# Patient Record
Sex: Male | Born: 1965
Health system: Southern US, Community
[De-identification: ages and names within clinical notes are randomized; demographics above are authoritative.]

## PROBLEM LIST (undated history)

## (undated) DIAGNOSIS — R002 Palpitations: Secondary | ICD-10-CM

## (undated) DIAGNOSIS — I10 Essential (primary) hypertension: Secondary | ICD-10-CM

## (undated) DIAGNOSIS — G4752 REM sleep behavior disorder: Secondary | ICD-10-CM

## (undated) DIAGNOSIS — J984 Other disorders of lung: Secondary | ICD-10-CM

## (undated) HISTORY — DX: Essential (primary) hypertension: I10

## (undated) HISTORY — DX: Palpitations: R00.2

## (undated) HISTORY — DX: Other disorders of lung: J98.4

## (undated) HISTORY — PX: VASECTOMY: SHX75

## (undated) HISTORY — PX: LIPOMA EXCISION: SHX5283

## (undated) HISTORY — DX: REM sleep behavior disorder: G47.52

---

## 2007-06-22 ENCOUNTER — Ambulatory Visit: Payer: Self-pay | Admitting: Family Medicine

## 2007-06-22 DIAGNOSIS — N509 Disorder of male genital organs, unspecified: Secondary | ICD-10-CM | POA: Insufficient documentation

## 2007-06-22 LAB — CONVERTED CEMR LAB
Urobilinogen, UA: 0.2
WBC Urine, dipstick: NEGATIVE

## 2007-06-23 ENCOUNTER — Encounter: Admission: RE | Admit: 2007-06-23 | Discharge: 2007-06-23 | Payer: Self-pay | Admitting: Family Medicine

## 2007-09-06 ENCOUNTER — Ambulatory Visit: Payer: Self-pay | Admitting: Family Medicine

## 2007-09-06 DIAGNOSIS — I10 Essential (primary) hypertension: Secondary | ICD-10-CM

## 2007-09-06 DIAGNOSIS — R9431 Abnormal electrocardiogram [ECG] [EKG]: Secondary | ICD-10-CM

## 2007-09-06 HISTORY — DX: Abnormal electrocardiogram (ECG) (EKG): R94.31

## 2007-09-06 HISTORY — DX: Essential (primary) hypertension: I10

## 2007-09-28 ENCOUNTER — Ambulatory Visit: Payer: Self-pay | Admitting: Cardiology

## 2007-10-14 ENCOUNTER — Telehealth (INDEPENDENT_AMBULATORY_CARE_PROVIDER_SITE_OTHER): Payer: Self-pay | Admitting: *Deleted

## 2008-06-01 ENCOUNTER — Ambulatory Visit: Payer: Self-pay | Admitting: Family Medicine

## 2008-06-01 LAB — CONVERTED CEMR LAB: Rapid Strep: NEGATIVE

## 2008-08-13 ENCOUNTER — Ambulatory Visit: Payer: Self-pay | Admitting: Family Medicine

## 2008-10-03 ENCOUNTER — Ambulatory Visit: Payer: Self-pay | Admitting: Family Medicine

## 2008-10-04 LAB — CONVERTED CEMR LAB
AST: 16 units/L (ref 0–37)
Albumin: 4.5 g/dL (ref 3.5–5.2)
Alkaline Phosphatase: 60 units/L (ref 39–117)
BUN: 16 mg/dL (ref 6–23)
CO2: 28 meq/L (ref 19–32)
Calcium: 9.1 mg/dL (ref 8.4–10.5)
Chloride: 101 meq/L (ref 96–112)
Creatinine, Ser: 1.05 mg/dL (ref 0.40–1.50)
Glucose, Bld: 112 mg/dL — ABNORMAL HIGH (ref 70–99)
LDL Cholesterol: 135 mg/dL — ABNORMAL HIGH (ref 0–99)
Sodium: 140 meq/L (ref 135–145)
Total Protein: 7.1 g/dL (ref 6.0–8.3)
Triglycerides: 111 mg/dL (ref <150)

## 2009-02-01 ENCOUNTER — Ambulatory Visit: Payer: Self-pay | Admitting: Family Medicine

## 2009-02-01 DIAGNOSIS — E785 Hyperlipidemia, unspecified: Secondary | ICD-10-CM

## 2009-02-01 HISTORY — DX: Hyperlipidemia, unspecified: E78.5

## 2009-03-01 ENCOUNTER — Encounter: Admission: RE | Admit: 2009-03-01 | Discharge: 2009-03-01 | Payer: Self-pay | Admitting: Family Medicine

## 2009-03-01 ENCOUNTER — Ambulatory Visit: Payer: Self-pay | Admitting: Family Medicine

## 2009-05-03 ENCOUNTER — Ambulatory Visit: Payer: Self-pay | Admitting: Family Medicine

## 2009-05-03 DIAGNOSIS — H00019 Hordeolum externum unspecified eye, unspecified eyelid: Secondary | ICD-10-CM | POA: Insufficient documentation

## 2009-05-03 LAB — CONVERTED CEMR LAB: Blood Glucose, AC Bkfst: 99 mg/dL

## 2009-08-09 ENCOUNTER — Ambulatory Visit: Payer: Self-pay | Admitting: Family Medicine

## 2009-08-09 DIAGNOSIS — M25559 Pain in unspecified hip: Secondary | ICD-10-CM | POA: Insufficient documentation

## 2009-08-09 DIAGNOSIS — F40298 Other specified phobia: Secondary | ICD-10-CM | POA: Insufficient documentation

## 2009-08-09 DIAGNOSIS — M629 Disorder of muscle, unspecified: Secondary | ICD-10-CM | POA: Insufficient documentation

## 2009-08-20 ENCOUNTER — Encounter: Admission: RE | Admit: 2009-08-20 | Discharge: 2009-09-20 | Payer: Self-pay | Admitting: Family Medicine

## 2009-08-20 ENCOUNTER — Encounter: Payer: Self-pay | Admitting: Family Medicine

## 2009-09-17 ENCOUNTER — Encounter: Payer: Self-pay | Admitting: Family Medicine

## 2009-09-24 ENCOUNTER — Telehealth: Payer: Self-pay | Admitting: Family Medicine

## 2009-09-25 ENCOUNTER — Encounter: Payer: Self-pay | Admitting: Family Medicine

## 2010-02-19 ENCOUNTER — Ambulatory Visit: Payer: Self-pay | Admitting: Family Medicine

## 2010-02-19 DIAGNOSIS — R5383 Other fatigue: Secondary | ICD-10-CM

## 2010-02-19 DIAGNOSIS — R5381 Other malaise: Secondary | ICD-10-CM | POA: Insufficient documentation

## 2010-02-20 ENCOUNTER — Encounter: Payer: Self-pay | Admitting: Family Medicine

## 2010-02-20 LAB — CONVERTED CEMR LAB
Albumin: 4.4 g/dL (ref 3.5–5.2)
Alkaline Phosphatase: 46 units/L (ref 39–117)
BUN: 9 mg/dL (ref 6–23)
Basophils Absolute: 0 10*3/uL (ref 0.0–0.1)
Basophils Relative: 1 % (ref 0–1)
Cholesterol: 198 mg/dL (ref 0–200)
Creatinine, Ser: 0.94 mg/dL (ref 0.40–1.50)
HCT: 44.3 % (ref 39.0–52.0)
LDL Cholesterol: 123 mg/dL — ABNORMAL HIGH (ref 0–99)
Lymphocytes Relative: 31 % (ref 12–46)
MCHC: 33.6 g/dL (ref 30.0–36.0)
Neutro Abs: 3.4 10*3/uL (ref 1.7–7.7)
Platelets: 276 10*3/uL (ref 150–400)
RBC: 4.83 M/uL (ref 4.22–5.81)
RDW: 12.5 % (ref 11.5–15.5)
Total Bilirubin: 0.8 mg/dL (ref 0.3–1.2)
Total Protein: 6.6 g/dL (ref 6.0–8.3)
Triglycerides: 73 mg/dL (ref <150)
VLDL: 15 mg/dL (ref 0–40)
WBC: 5.8 10*3/uL (ref 4.0–10.5)

## 2010-06-17 NOTE — Miscellaneous (Signed)
Summary: PT Initial Summary/[facility]  PT Initial Summary/[facility]   Imported By: Lanelle Bal 08/26/2009 09:09:32  _____________________________________________________________________  External Attachment:    Type:   Image     Comment:   External Document
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
## 2010-06-17 NOTE — Consult Note (Signed)
Summary: [facility]  [facility]   Imported By: Lanelle Bal 10/03/2009 09:19:37  _____________________________________________________________________  External Attachment:    Type:   Image     Comment:   External Document

## 2010-06-17 NOTE — Progress Notes (Signed)
Summary: opthamology referral  Phone Note Call from Patient   Caller: Patient Summary of Call: Pt has stye on L upper eye lid. Pt states you have seen him for this a few times over the past year. Pt would like to know if he can be referred to opthamology [facility]). Please advise. Initial call taken by: Payton Spark CMA,  Sep 24, 2009 11:31 AM  Follow-up for Phone Call        done. Follow-up by: Seymour Bars DO,  Sep 24, 2009 11:51 AM     Appended Document: opthamology referral referral faxed to [facility]

## 2010-06-17 NOTE — Miscellaneous (Signed)
Summary: PT Discharge/[facility]  PT Discharge/[facility]   Imported By: Lanelle Bal 10/04/2009 11:22:45  _____________________________________________________________________  External Attachment:    Type:   Image     Comment:   External Document
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
## 2010-06-27 ENCOUNTER — Ambulatory Visit (INDEPENDENT_AMBULATORY_CARE_PROVIDER_SITE_OTHER): Payer: BC Managed Care – PPO | Admitting: Family Medicine

## 2010-06-27 ENCOUNTER — Encounter: Payer: Self-pay | Admitting: Family Medicine

## 2010-06-27 ENCOUNTER — Telehealth: Payer: Self-pay | Admitting: Family Medicine

## 2010-06-27 DIAGNOSIS — F410 Panic disorder [episodic paroxysmal anxiety] without agoraphobia: Secondary | ICD-10-CM

## 2010-06-27 DIAGNOSIS — I10 Essential (primary) hypertension: Secondary | ICD-10-CM

## 2010-06-27 HISTORY — DX: Panic disorder (episodic paroxysmal anxiety): F41.0

## 2010-07-03 NOTE — Progress Notes (Signed)
Summary: [facility]-med intolerance  Phone Note Call from Patient Call back at Home Phone [phone]   Caller: Patient Summary of Call: Pt states he was given Sertraline at visit today and remembers that he tried Zoloft about 3 years ago and coul no ttolerate as it made him feel like a zombie.  Pt's wife suggests "equivalent, like xanax".  Please advise. Initial call taken by: Francee Piccolo CMA Duncan Dull),  June 27, 2010 4:00 PM  Follow-up for Phone Call        xanax is not the same- it is a 'rescue' medicine for anxiety but we can start with that.  It can be habit forming, make him sleepy and he cannot drink ETOH with it.  I will delete zoloft from list and send in xanax. Follow-up by: Seymour Bars DO,  June 27, 2010 4:47 PM     Appended Document: [facility]-med intolerance notified pt of above.
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
## 2010-08-28 ENCOUNTER — Other Ambulatory Visit: Payer: Self-pay | Admitting: Family Medicine

## 2010-09-23 ENCOUNTER — Other Ambulatory Visit: Payer: Self-pay | Admitting: Family Medicine

## 2010-09-30 ENCOUNTER — Ambulatory Visit (INDEPENDENT_AMBULATORY_CARE_PROVIDER_SITE_OTHER): Payer: BC Managed Care – PPO | Admitting: Family Medicine

## 2010-09-30 ENCOUNTER — Encounter: Payer: Self-pay | Admitting: Family Medicine

## 2010-09-30 DIAGNOSIS — J309 Allergic rhinitis, unspecified: Secondary | ICD-10-CM

## 2010-09-30 DIAGNOSIS — R531 Weakness: Secondary | ICD-10-CM | POA: Insufficient documentation

## 2010-09-30 DIAGNOSIS — M5412 Radiculopathy, cervical region: Secondary | ICD-10-CM

## 2010-09-30 DIAGNOSIS — F40298 Other specified phobia: Secondary | ICD-10-CM

## 2010-09-30 DIAGNOSIS — M5030 Other cervical disc degeneration, unspecified cervical region: Secondary | ICD-10-CM

## 2010-09-30 DIAGNOSIS — I10 Essential (primary) hypertension: Secondary | ICD-10-CM

## 2010-09-30 HISTORY — DX: Allergic rhinitis, unspecified: J30.9

## 2010-09-30 HISTORY — DX: Radiculopathy, cervical region: M54.12

## 2010-09-30 NOTE — Assessment & Plan Note (Signed)
Hx of cervical DDD, now with symptoms of neuritis in the scalp and possibly the cause for anterior R sided neck symptoms given absence of r sided lymphadenopathy or thyromegaly.  He opted to hold off on MRI at this point but will call if he changes his mind.

## 2010-09-30 NOTE — Assessment & Plan Note (Signed)
Exam c/w flare of seasonal allergies.  His postnasal drip may also be the cause for L sided shoddy lymph nodes which I told him were reactive.  His fullness in his throat is likely to be from the postnasal drip. No worrisome findings present.  Be sure to take zyrtec everyday.

## 2010-09-30 NOTE — Patient Instructions (Signed)
F/U with dermatology for spot on R forearm.  Will make psychology referral for you.  Take Zyrtec every night for allergies/ postnasal drip.  Call me if you want to persue neck MRI.  Call me if any problems.  Happy Birthday!!!

## 2010-09-30 NOTE — Assessment & Plan Note (Signed)
BP high here but at goal at home. Continue current meds.

## 2010-09-30 NOTE — Progress Notes (Signed)
  Subjective:    Patient ID: Clinton Cox, male    DOB: 25-Dec-1965, 45 y.o.   MRN: [id number]  HPI 45 yo WM presents for a lump on the R side of his neck.  He thinks it's been there for the past 5 days.  He describes a feeling of 'fullness' only on the R side when he swallows but no sore throat.  He has allergies and is taking Zyrtec on most days but still has itchy watery eyes and postnasal drip.  He also has been checking his BP at home and it's running 135/ 80 on avg.  He has a hx of cervical disc dz, had a w/u for this about 3 yrs ago.  He has been having some tingling and numbness with a burning mild pain in the scalp lately.    BP 143/96  Pulse 81  Temp(Src) 98.6 F (37 C) (Oral)  Ht 5\' 11"  (1.803 m)  Wt 188 lb (85.276 kg)  BMI 26.22 kg/m2  SpO2 100%    Review of Systems  Constitutional: Negative for fever, diaphoresis, appetite change, fatigue and unexpected weight change.  HENT: Positive for congestion, rhinorrhea, sneezing and postnasal drip. Negative for sore throat, trouble swallowing, neck pain, neck stiffness, voice change and sinus pressure.   Eyes: Positive for itching.  Respiratory: Negative for cough, chest tightness and shortness of breath.   Cardiovascular: Negative for chest pain, palpitations and leg swelling.  Gastrointestinal: Negative for nausea and vomiting.  Neurological: Positive for numbness. Negative for dizziness, weakness, light-headedness and headaches.  Psychiatric/Behavioral: Negative for sleep disturbance and dysphoric mood. The patient is nervous/anxious.        Objective:   Physical Exam  Constitutional: He appears well-developed and well-nourished. No distress.  HENT:  Head: Normocephalic and atraumatic.  Right Ear: External ear normal.  Left Ear: External ear normal.  Nose: Nose normal.  Mouth/Throat: Oropharynx is clear and moist.       Clear rhinorrhea; cobblestoning in the post o/p  Eyes: Conjunctivae are normal.       Watery eyes  with mild bilat sclera injection  Neck: Normal range of motion. Neck supple. No thyromegaly present.  Cardiovascular: Normal rate, regular rhythm and normal heart sounds.   No murmur heard. Pulmonary/Chest: Effort normal and breath sounds normal. No respiratory distress. He has no wheezes.  Musculoskeletal: He exhibits no edema.  Lymphadenopathy:    He has cervical adenopathy (shoddy L anterior cervical chain LA, freely moveable and nontender).  Skin: Skin is warm and dry.  Psychiatric: He has a normal mood and affect.          Assessment & Plan:

## 2010-09-30 NOTE — Assessment & Plan Note (Signed)
[facility]                            CARDIOLOGY OFFICE NOTE   NAME:Kernen, EYOEL THROGMORTON                      MRN:          [id number]  DATE:09/28/2007                            DOB:          [birth year]    Mr. Swor is a pleasant 45 year old gentleman who presents for  evaluation of palpitations and mild dyspnea.  He has no prior cardiac  history other than he did wear a monitor in the past secondary to  occasional palpitations.  The patient states that he has been in good  shape in the past, but has not been exercising recently due to  employment issues; and also he has been caring for 2 parents who have  been he ill.  He has noticed that he has had mild increased dyspnea on  exertion with more extreme activities, but not with routine activities.  There is no orthopnea, PND, pedal edema, presyncope, syncope, or  exertional chest pain.  He also occasionally has palpitations.  These  occur predominantly at night.  They are described as his heart  skipping.  They did they are not sustained.  There is mild dyspnea with  these episodes, but there is no syncope or chest pain.  He recently saw  Dr. Cathey Endow, and his electrocardiogram was felt to be abnormal.  Because  the above, we were asked to further evaluate.   MEDICATIONS INCLUDE:  Lisinopril 10 mg daily.   PAST MEDICAL HISTORY:  Significant for recently diagnosed hypertension.  There is no diabetes mellitus or hyperlipidemia.  He has had a previous  lipoma removed from his back.   SOCIAL HISTORY:  He does not smoke, and he rarely consumes alcohol.   FAMILY HISTORY:  Positive for lung cancer in his father, and he died in  06-23-22.  His mother has also been diagnosed with throat cancer.  There  is no coronary artery disease in his family.   REVIEW OF SYSTEMS:  There is no headaches, fevers, or chills.  There is  no shortness of breath, cough, or hemoptysis.  There is no dysphagia,  odynophagia, melena,  or hematochezia.  There is no dysuria or hematuria.  There is no seizure activity.  There is no orthopnea, PND, or pedal  edema.  The remaining systems are negative.   PHYSICAL EXAMINATION:  Today shows a blood pressure 129/80, and his  pulse was 90.  He weighs 185 pounds.  He is well-developed and well-nourished in no acute distress.  SKIN:  Warm and dry.  He does not appear to be depressed.  There is no peripheral clubbing.  BACK:  Normal.  HEENT:  Normal with normal eyelids.  NECK:  Supple with a normal upstroke bilaterally.  There are no bruits  noted.  There is no jugular distention, and no thyromegaly is noted.  CHEST:  Clear to auscultation.  No expansion.  CARDIOVASCULAR EXAM:  Reveals a regular rate and rhythm with a normal S1  and S2.  There are no murmurs, rubs, or gallops noted.  Note, he does have some striae over his shoulders.  ABDOMINAL EXAM:  Nontender.  Positive bowel sounds.  No  hepatosplenomegaly, no mass appreciated.  There is no abdominal bruit.  He has 2+ femoral pulses bilaterally.  No bruits.  EXTREMITIES:  Show no edema.  I could palpate no cords.  He has 2+  dorsalis pedis pulses bilaterally.  NEUROLOGIC EXAM:  Grossly intact.   I do have a copy of his electrocardiogram from Dr. Ovidio Kin office on  September 06, 2007.  He had a sinus tachycardia at a rate of 112.  There is  probable limb lead reversal.  There are nonspecific ST changes.   DIAGNOSIS:  1. Palpitations.  The etiology of this is unclear, but it may be PVCs.      We will schedule him to have a CardioNet monitor to more fully      evaluate.  We will check a BMET to check his potassium as well as a      TSH.  2. Mild dyspnea on exertion.  Mr. Stick has not exercised recently,      and he is contemplating beginning an exercise program.  We will      schedule him to have a stress echocardiogram both to quantify his      LV function and to risk stratify.  If it shows no abnormalities,      then we  will not pursue further ischemia evaluation.  3. Hypertension.  His blood pressure appears to be adequately      controlled on his present medications.  We will check a BMET to      follow his potassium and renal function given his ACE inhibitor      use.   We will see him back in approximately 6 weeks.     Madolyn Frieze Jens Som, MD, [facility]  Electronically Signed    BSC/MedQ  DD: 09/28/2007  DT: 09/28/2007  Job #: [id number]   cc:   Seymour Bars, D.O.

## 2010-09-30 NOTE — Assessment & Plan Note (Signed)
Still a problem.  Clinton Cox admits to constantly worrying about each little lump or bump after losing his dad to cancer and then having his mom diagnosed with cancer.  I really think he would be a good candidate for therapy.  I will refer him to Dr Dellia Cloud.

## 2010-10-17 ENCOUNTER — Ambulatory Visit: Payer: BC Managed Care – PPO | Admitting: Psychology

## 2010-11-04 ENCOUNTER — Encounter: Payer: Self-pay | Admitting: Family Medicine

## 2010-11-04 ENCOUNTER — Telehealth: Payer: Self-pay | Admitting: Family Medicine
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
## 2010-11-04 NOTE — Telephone Encounter (Signed)
Pt called and needed the nurse to return his call regarding stomach issues. Plan:  Call returned but had to [facility] for the patient that his call had been returned. [name], LPN [name]

## 2010-11-05 ENCOUNTER — Encounter: Payer: Self-pay | Admitting: Family Medicine

## 2010-11-05 ENCOUNTER — Ambulatory Visit (INDEPENDENT_AMBULATORY_CARE_PROVIDER_SITE_OTHER): Payer: BC Managed Care – PPO | Admitting: Family Medicine

## 2010-11-05 VITALS — BP 141/86 | HR 82 | Ht 70.0 in | Wt 188.0 lb

## 2010-11-05 DIAGNOSIS — R252 Cramp and spasm: Secondary | ICD-10-CM

## 2010-11-05 DIAGNOSIS — L559 Sunburn, unspecified: Secondary | ICD-10-CM

## 2010-11-05 DIAGNOSIS — E860 Dehydration: Secondary | ICD-10-CM

## 2010-11-05 DIAGNOSIS — R1013 Epigastric pain: Secondary | ICD-10-CM

## 2010-11-05 LAB — POCT URINALYSIS DIPSTICK
Bilirubin, UA: NEGATIVE
Ketones, UA: NEGATIVE
Nitrite, UA: NEGATIVE
pH, UA: 7

## 2010-11-05 NOTE — Patient Instructions (Signed)
UA is normal.  Check electrolytes today.   Will call you w/ results tomorrow.  Use Aloe Vera on sunburn.  Hydrate well with clear fluids (not alcohol) throughout the day.  Call if symptoms worsen or your are not improved by next wk.
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
## 2010-11-06 ENCOUNTER — Telehealth: Payer: Self-pay | Admitting: Family Medicine

## 2010-11-06 LAB — COMPLETE METABOLIC PANEL WITH GFR
ALT: 18 U/L (ref 0–53)
Albumin: 4.3 g/dL (ref 3.5–5.2)
Alkaline Phosphatase: 53 U/L (ref 39–117)
Calcium: 9.7 mg/dL (ref 8.4–10.5)
Chloride: 101 mEq/L (ref 96–112)
Total Bilirubin: 0.4 mg/dL (ref 0.3–1.2)
Total Protein: 6.7 g/dL (ref 6.0–8.3)

## 2010-11-06 LAB — MAGNESIUM: Magnesium: 2 mg/dL (ref 1.5–2.5)

## 2010-11-06 LAB — LIPASE: Lipase: 24 U/L (ref 0–75)

## 2010-11-06 NOTE — Telephone Encounter (Signed)
Pls let pt know that his labs came back normal.

## 2010-11-06 NOTE — Telephone Encounter (Signed)
Pt aware of the above  

## 2010-12-05 ENCOUNTER — Ambulatory Visit: Payer: BC Managed Care – PPO | Admitting: Family Medicine

## 2010-12-08 ENCOUNTER — Other Ambulatory Visit: Payer: Self-pay | Admitting: Family Medicine

## 2011-02-09 ENCOUNTER — Telehealth: Payer: Self-pay | Admitting: Family Medicine

## 2011-02-09 NOTE — Telephone Encounter (Signed)
Pt calling and stating he had heart palpitations and didn't feel good on Saturday X 4 hours, and didn't eat well cause didn't feel good.  Decreased amt of sleep during the night X 1 mth.  Clinching teeth at night because wakes up with his jaw sore. Waking up with mulitple thoughts of dying of cancer and his father's death.  Minimal SOB.  In rib cage off and on some pain # 5/10 and last approx 3 seconds.  Feels weak.  Hands some tingling in fingertips.  Loss of 5 # in last 3 days without loss of appetite.  Pt has been really anxious and history of mental problems and history of his father dying lung CA.  Has been diagnosed with heart palpitations in the past but was told that not life threatening.  Had holter monitor and stress test essentially normal.  Pt is not currently on any med for the anxiousness or depression.  Has used Xanex in the past for anxiousness given.  No palpitations thus far today. [name], LPN [name] Plan:  Note:  Offered appt  For Tuesday with Dr. Maurie Boettcher declined.  Wants to wait to see Dr. Linford Arnold on Wednesday.  Pt told could go to UC today, but declined.  Pt accepted appt on Wednesday with Dr. Linford Arnold, but was instructed to call if symptoms worsen or change because will need to go to [facility] or ED.  Pt voiced understanding. [name], LPN [name]

## 2011-02-11 ENCOUNTER — Ambulatory Visit (INDEPENDENT_AMBULATORY_CARE_PROVIDER_SITE_OTHER): Payer: BC Managed Care – PPO | Admitting: Family Medicine

## 2011-02-11 ENCOUNTER — Encounter: Payer: Self-pay | Admitting: Family Medicine

## 2011-02-11 VITALS — BP 146/99 | HR 83 | Wt 185.0 lb

## 2011-02-11 DIAGNOSIS — R002 Palpitations: Secondary | ICD-10-CM

## 2011-02-11 DIAGNOSIS — F411 Generalized anxiety disorder: Secondary | ICD-10-CM

## 2011-02-11 DIAGNOSIS — F419 Anxiety disorder, unspecified: Secondary | ICD-10-CM

## 2011-02-11 DIAGNOSIS — Z23 Encounter for immunization: Secondary | ICD-10-CM

## 2011-02-11 DIAGNOSIS — R6884 Jaw pain: Secondary | ICD-10-CM
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
## 2011-02-11 NOTE — Patient Instructions (Signed)
Please call me when you decide about the medicine or not.

## 2011-03-19 ENCOUNTER — Encounter: Payer: Self-pay | Admitting: Family Medicine

## 2011-03-19 ENCOUNTER — Other Ambulatory Visit: Payer: Self-pay | Admitting: Family Medicine

## 2011-03-19 ENCOUNTER — Ambulatory Visit (INDEPENDENT_AMBULATORY_CARE_PROVIDER_SITE_OTHER): Payer: BC Managed Care – PPO | Admitting: Family Medicine

## 2011-03-19 VITALS — BP 159/88 | HR 99 | Wt 184.0 lb

## 2011-03-19 DIAGNOSIS — R238 Other skin changes: Secondary | ICD-10-CM

## 2011-03-19 DIAGNOSIS — R6882 Decreased libido: Secondary | ICD-10-CM

## 2011-03-19 DIAGNOSIS — Z0000 Encounter for general adult medical examination without abnormal findings: Secondary | ICD-10-CM

## 2011-03-19 DIAGNOSIS — L853 Xerosis cutis: Secondary | ICD-10-CM

## 2011-03-19 NOTE — Patient Instructions (Signed)
Start a regular exercise program and make sure you are eating a healthy diet Try to eat 4 servings of dairy a day or take a calcium supplement (500mg twice a day). Your vaccines are up to date.   
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
## 2011-03-20 ENCOUNTER — Other Ambulatory Visit: Payer: Self-pay | Admitting: Family Medicine

## 2011-03-20 ENCOUNTER — Telehealth: Payer: Self-pay | Admitting: *Deleted

## 2011-03-20 LAB — HEMOGLOBIN A1C: Mean Plasma Glucose: 111 mg/dL (ref <117)

## 2011-03-20 LAB — LIPID PANEL
Cholesterol: 200 mg/dL (ref 0–200)
HDL: 61 mg/dL (ref >39)
Triglycerides: 77 mg/dL (ref <150)
VLDL: 15 mg/dL (ref 0–40)

## 2011-03-20 LAB — CBC WITH DIFFERENTIAL/PLATELET
Eosinophils Absolute: 0.1 10*3/uL (ref 0.0–0.7)
Eosinophils Relative: 1 % (ref 0–5)
HCT: 45.8 % (ref 39.0–52.0)
Hemoglobin: 15.5 g/dL (ref 13.0–17.0)
Monocytes Absolute: 0.5 10*3/uL (ref 0.1–1.0)
WBC: 5.9 10*3/uL (ref 4.0–10.5)

## 2011-03-20 LAB — COMPLETE METABOLIC PANEL WITH GFR
AST: 18 U/L (ref 0–37)
BUN: 9 mg/dL (ref 6–23)
Calcium: 9.3 mg/dL (ref 8.4–10.5)
Chloride: 100 mEq/L (ref 96–112)
Glucose, Bld: 125 mg/dL — ABNORMAL HIGH (ref 70–99)
Potassium: 4.4 mEq/L (ref 3.5–5.3)
Total Protein: 6.6 g/dL (ref 6.0–8.3)

## 2011-03-20 LAB — TESTOSTERONE, FREE, TOTAL, SHBG
Testosterone-% Free: 1.4 % — ABNORMAL LOW (ref 1.6–2.9)
Testosterone: 614.12 ng/dL (ref 250–890)

## 2011-03-20 LAB — TSH: TSH: 2.247 u[IU]/mL (ref 0.350–4.500)

## 2011-03-20 MED ORDER — KETOCONAZOLE 2 % EX SHAM: MEDICATED_SHAMPOO | CUTANEOUS | Status: AC

## 2011-03-20 NOTE — Telephone Encounter (Signed)
Pt notified.mailed labs

## 2011-03-20 NOTE — Telephone Encounter (Signed)
Message copied by Wyline Beady on Fri Mar 20, 2011  2:43 PM ------      Message from: Nani Gasser D      Created: Fri Mar 20, 2011  8:00 AM       Thyroid and complete blood count are normal. Testosterone level looks great. At 600. LDL cholesterol is just slightly elevated at 124. This is about what he is run the last couple years. I just encouraged him to make sure he is getting at least 30 minutes of aerobic exercise 5 days a week. Complete metabolic panel is normal except for an elevated sugar of 125. Normal is under 100. Can you please call the lab and see if they can add an A1c. Because his thyroid is normal I would like to treat his scalp with Nizoral shampoo about a month and if he still not noticing significant improvement and in the dryness and flaking then we can add a topical steroid foam that he can apply to the scalp. I will send her over the prescription shampoo today.

## 2011-04-28 ENCOUNTER — Telehealth: Payer: Self-pay | Admitting: *Deleted

## 2011-04-28 MED ORDER — FLUOXETINE HCL 10 MG PO CAPS: ORAL_CAPSULE | ORAL | Status: DC

## 2011-04-28 NOTE — Telephone Encounter (Signed)
Pt notified of MD instructions. KJ LPN 

## 2011-04-28 NOTE — Telephone Encounter (Signed)
OK. Will start fluoxetine. Needs f/u appt  In 3 weeks.

## 2011-04-28 NOTE — Telephone Encounter (Signed)
Going on business trip tomorrow to [place] for 5 days. Pt states hasn't been feeling well. States has no energy, arms kinda tingling. Seen at Dr. Ellin Goodie office and done bloodwork and EKG. EKG was fine. Blood work will not be back until tomorrow. Nurse informed him he had a lot of anxiety going on that was causing these symptoms and he said you had told him this as well. Pt would like to start some med for this- not Zoloft though because this made him feeling like a zombie. Got xanax from Dr. Cathey Endow #30 a year ago and has 10 left now.. Uses [facility] in [place].

## 2011-05-05 ENCOUNTER — Encounter: Payer: Self-pay | Admitting: Family Medicine

## 2011-05-07 ENCOUNTER — Encounter: Payer: Self-pay | Admitting: Family Medicine

## 2011-05-07 ENCOUNTER — Ambulatory Visit (INDEPENDENT_AMBULATORY_CARE_PROVIDER_SITE_OTHER): Payer: BC Managed Care – PPO | Admitting: Family Medicine

## 2011-05-07 VITALS — BP 159/96 | HR 87 | Temp 97.7°F | Wt 185.0 lb

## 2011-05-07 DIAGNOSIS — F411 Generalized anxiety disorder: Secondary | ICD-10-CM

## 2011-05-07 DIAGNOSIS — F419 Anxiety disorder, unspecified: Secondary | ICD-10-CM

## 2011-05-07 DIAGNOSIS — M542 Cervicalgia: Secondary | ICD-10-CM

## 2011-05-07 MED ORDER — ALPRAZOLAM 0.5 MG PO TABS: 0.2500 mg | ORAL_TABLET | ORAL | Status: DC | PRN
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
## 2011-05-28 ENCOUNTER — Ambulatory Visit (INDEPENDENT_AMBULATORY_CARE_PROVIDER_SITE_OTHER): Payer: BC Managed Care – PPO | Admitting: Family Medicine

## 2011-05-28 ENCOUNTER — Encounter: Payer: Self-pay | Admitting: Family Medicine

## 2011-05-28 DIAGNOSIS — F419 Anxiety disorder, unspecified: Secondary | ICD-10-CM

## 2011-05-28 DIAGNOSIS — F411 Generalized anxiety disorder: Secondary | ICD-10-CM

## 2011-05-28 MED ORDER — FLUOXETINE HCL 20 MG PO CAPS: 20.0000 mg | ORAL_CAPSULE | Freq: Every day | ORAL | Status: DC

## 2011-05-28 NOTE — Progress Notes (Signed)
  Subjective:    Patient ID: Clinton Cox, male    DOB: 01-29-1966, 46 y.o.   MRN: [id number]  HPI Says has been taking the fluoxetine 20mg  and says feels less anxious but feels dec motivation.  Says felt like a zombie on sertraline.   Saw neuro for his posterior neck.  They are planning to schedule him with ENT.  Still having anterior neck pain.  Did try buying a mouth guard and says made his mouth dry.  It is irritating his gums. Also plans on seeing dentist.   Review of Systems     Objective:   Physical Exam  Constitutional: He is oriented to person, place, and time. He appears well-developed and well-nourished.  HENT:  Head: Normocephalic and atraumatic.  Eyes: Conjunctivae are normal. Pupils are equal, round, and reactive to light.  Cardiovascular: Normal rate, regular rhythm and normal heart sounds.   Pulmonary/Chest: Effort normal and breath sounds normal.  Neurological: He is alert and oriented to person, place, and time.  Skin: Skin is warm and dry.  Psychiatric: He has a normal mood and affect. His behavior is normal.          Assessment & Plan:  Anxiety - GAD-7 score of 5 today.  We discussed different options like inc the fluoxetine to 40mg  vs changing medications. He wants to keep dose the same and f/u in 3 weeks.  We also discussed counseling.    HTN - Looks great today.

## 2011-06-16 ENCOUNTER — Other Ambulatory Visit: Payer: Self-pay | Admitting: *Deleted

## 2011-06-16 MED ORDER — AMLODIPINE BESYLATE 5 MG PO TABS: 5.0000 mg | ORAL_TABLET | Freq: Every day | ORAL | Status: DC

## 2011-06-18 ENCOUNTER — Ambulatory Visit: Payer: BC Managed Care – PPO | Admitting: Family Medicine

## 2011-07-29 ENCOUNTER — Other Ambulatory Visit: Payer: Self-pay | Admitting: *Deleted

## 2011-07-29 MED ORDER — ACYCLOVIR 200 MG PO CAPS: 200.0000 mg | ORAL_CAPSULE | Freq: Every day | ORAL | Status: DC

## 2011-08-19 ENCOUNTER — Ambulatory Visit (INDEPENDENT_AMBULATORY_CARE_PROVIDER_SITE_OTHER): Payer: BC Managed Care – PPO | Admitting: Physician Assistant

## 2011-08-19 ENCOUNTER — Encounter: Payer: Self-pay | Admitting: Physician Assistant

## 2011-08-19 VITALS — BP 154/90 | HR 100 | Wt 182.0 lb

## 2011-08-19 DIAGNOSIS — F410 Panic disorder [episodic paroxysmal anxiety] without agoraphobia: Secondary | ICD-10-CM

## 2011-08-19 DIAGNOSIS — I10 Essential (primary) hypertension: Secondary | ICD-10-CM

## 2011-08-19 DIAGNOSIS — J3489 Other specified disorders of nose and nasal sinuses: Secondary | ICD-10-CM

## 2011-08-19 MED ORDER — ALPRAZOLAM 0.5 MG PO TABS: 0.2500 mg | ORAL_TABLET | ORAL | Status: DC | PRN

## 2011-08-19 NOTE — Progress Notes (Signed)
  Subjective:    Patient ID: Clinton Cox, male    DOB: 11-Jul-1965, 46 y.o.   MRN: [id number]  HPI Patient presents to the clinic because he is concerned about a sore in his nose. Patient was out of town about 2 months ago when he blew his nose and blood came out and [facility]. The nurse practinoer told him to that a sore in his nose look concerning and to get it check out. Patient became very worired. His mom and dad have both had cancer. He went to an ENT and he told him it was nothing to worry about. He gave him cortisporon and saline mist and told him to follow up in 3 months. He used the medications given for 1 moth and nose bleeds did improve. Last night he blew is nose and blood came out again. He does report that he tries sometimes to blow so hard to make it bleed. He wants his nose checked again today. He does have seasonal allergies but denies any sinus pressure or pain. He has not ran a fever, has not had ear pain, and no cough or SOB. He has been so worried about his nose that he had to take a xanax before his office visit today.     Review of Systems     Objective:   Physical Exam  Constitutional: He appears well-developed and well-nourished.  HENT:  Head: Normocephalic and atraumatic.  Right Ear: External ear normal.  Left Ear: External ear normal.  Mouth/Throat: Oropharynx is clear and moist. No oropharyngeal exudate.       TM's normal. Bilateral turbinates very red, swollen, and irritated. Dried crusted blood in left nare behind a swollen turbinate. This is likey the cause of the blood when he blows his nose.  Eyes: Conjunctivae are normal.  Neck: Normal range of motion. Neck supple.  Cardiovascular: Regular rhythm and normal heart sounds.   Pulmonary/Chest: Effort normal and breath sounds normal.  Lymphadenopathy:    He has no cervical adenopathy.  Psychiatric:       Very anxious throughout the whole interview. I had to reassure him multiple times that he  was going to be all right.           Assessment & Plan:  Nasal dryness-Continue on Ayr and Cortisporin Ointment and follow up with ENT. INformed patient to stop blowing nose so hard and so frequently. Start back on Zyrtec allergy pill daily if allergies start acting up and he becomes more runny. I informed him that Zyrtec may dry him out more. Use a humidifier at night and in bedroom. I reassured him that the ENT had checked him out and I had seen nothing concerning. He was very dry and irritated but could be from dry weather and trauma from blowing too hard too often.  HTN/Painic disorder-Today the patient did not want to change any medications stating "that was too much today". I believe that the increased bp could be from his anxiety today. I told him to take bp at home and if still about 140/90 then come in for a visit.

## 2011-08-19 NOTE — Patient Instructions (Addendum)
Nasal dryness-Continue on Ayr and Cortisporin Ointment and follow up with ENT. INformed patient to stop blowing nose so hard and so frequently. Start back on Zyrtec allergy pill daily if allergies start acting up and he becomes more runny. I informed him that Zyrtec may dry him out more. Use a humidifier at night and in bedroom. I reassured him that the ENT had checked him out and I had seen nothing concerning. He was very dry and irritated but could be from dry weather and trauma from blowing too hard too often.

## 2011-09-11 ENCOUNTER — Other Ambulatory Visit: Payer: Self-pay | Admitting: Family Medicine

## 2011-09-28 ENCOUNTER — Other Ambulatory Visit: Payer: Self-pay | Admitting: Family Medicine

## 2011-10-22 ENCOUNTER — Ambulatory Visit (INDEPENDENT_AMBULATORY_CARE_PROVIDER_SITE_OTHER): Payer: BC Managed Care – PPO | Admitting: Family Medicine

## 2011-10-22 ENCOUNTER — Encounter: Payer: Self-pay | Admitting: Family Medicine

## 2011-10-22 VITALS — BP 136/89 | HR 84 | Temp 98.0°F | Ht 70.0 in | Wt 187.0 lb

## 2011-10-22 DIAGNOSIS — J209 Acute bronchitis, unspecified: Secondary | ICD-10-CM

## 2011-10-22 DIAGNOSIS — J029 Acute pharyngitis, unspecified: Secondary | ICD-10-CM

## 2011-10-22 LAB — POCT RAPID STREP A (OFFICE): Rapid Strep A Screen: NEGATIVE

## 2011-10-22 MED ORDER — AZITHROMYCIN 250 MG PO TABS: ORAL_TABLET | ORAL | Status: AC

## 2011-10-22 MED ORDER — FEXOFENADINE-PSEUDOEPHED ER 180-240 MG PO TB24: 1.0000 | ORAL_TABLET | Freq: Every day | ORAL | Status: AC

## 2011-10-22 MED ORDER — HYDROCOD POLST-CHLORPHEN POLST 10-8 MG/5ML PO LQCR: 5.0000 mL | Freq: Two times a day (BID) | ORAL | Status: DC | PRN

## 2011-10-22 NOTE — Progress Notes (Signed)
  Subjective:    Patient ID: Clinton Cox, male    DOB: 21-Jun-1965, 46 y.o.   MRN: [id number]  Sore Throat  This is a new problem. The current episode started in the past 7 days (since Sunday). The problem has been gradually worsening. The fever has been present for 1 to 2 days. Associated symptoms include congestion, coughing, diarrhea, ear pain, a hoarse voice, swollen glands and trouble swallowing. Pertinent negatives include no abdominal pain, drooling, ear discharge, headaches, plugged ear sensation, shortness of breath, stridor or vomiting. He has had exposure to strep. He has had no exposure to mono. Exposure to: was sitting in the ED for Sunday. He has tried acetaminophen (psuedophed) for the symptoms. The treatment provided mild relief.  Cough This is a new problem. The current episode started in the past 7 days (since Tuesday). The problem has been gradually worsening. The problem occurs hourly. The cough is productive of sputum. Associated symptoms include ear pain. Pertinent negatives include no headaches or shortness of breath. The symptoms are aggravated by nothing. The treatment provided no relief. There is no history of asthma, bronchitis, COPD, emphysema, environmental allergies or pneumonia.      Review of Systems  HENT: Positive for ear pain, congestion, hoarse voice and trouble swallowing. Negative for drooling and ear discharge.   Respiratory: Positive for cough. Negative for shortness of breath and stridor.   Gastrointestinal: Positive for diarrhea. Negative for vomiting and abdominal pain.  Neurological: Negative for headaches.  Hematological: Negative for environmental allergies.      BP 136/89  Pulse 84  Temp(Src) 98 F (36.7 C) (Oral)  Wt 187 lb (84.823 kg)  SpO2 98% Objective:   Physical Exam  Nursing note and vitals reviewed. Constitutional: He is oriented to person, place, and time. He appears well-developed and well-nourished. He is cooperative.  Non-toxic  appearance. No distress.  HENT:  Head: Normocephalic and atraumatic.  Right Ear: Tympanic membrane, external ear and ear canal normal.  Left Ear: Tympanic membrane, external ear and ear canal normal.  Nose: Nose normal. Right sinus exhibits no maxillary sinus tenderness and no frontal sinus tenderness. Left sinus exhibits no maxillary sinus tenderness and no frontal sinus tenderness.  Mouth/Throat: Mucous membranes are normal. Posterior oropharyngeal erythema present. No oropharyngeal exudate or posterior oropharyngeal edema.  Eyes: Conjunctivae are normal. No scleral icterus.  Neck: Neck supple.  Cardiovascular: Normal rate, regular rhythm and normal heart sounds.   No murmur heard. Pulmonary/Chest: Effort normal and breath sounds normal. No stridor. No respiratory distress. He has no wheezes. He has no rales.  Musculoskeletal: He exhibits no edema.  Lymphadenopathy:    He has cervical adenopathy.       Right cervical: Superficial cervical adenopathy present. No deep cervical and no posterior cervical adenopathy present.      Left cervical: Superficial cervical adenopathy present. No deep cervical and no posterior cervical adenopathy present.  Neurological: He is alert and oriented to person, place, and time.  Skin: Skin is warm and dry.  Psychiatric: He has a normal mood and affect.          Results for orders placed in visit on 10/22/11  POCT RAPID STREP A (OFFICE)      Component Value Range   Rapid Strep A Screen Negative  Negative    Assessment & Plan:  #1 pharyngitis/bronchitis We'll place on Z-Pak, Allegra-D, Tussionex  At this time return in a week if not better.

## 2011-10-22 NOTE — Patient Instructions (Signed)
Pharyngitis, Viral and Bacterial  Pharyngitis is soreness (inflammation) or infection of the pharynx. It is also called a sore throat.  CAUSES   Most sore throats are caused by viruses and are part of a cold. However, some sore throats are caused by strep and other bacteria. Sore throats can also be caused by post nasal drip from draining sinuses, allergies and sometimes from sleeping with an open mouth. Infectious sore throats can be spread from person to person by coughing, sneezing and sharing cups or eating utensils.  TREATMENT   Sore throats that are viral usually last 3-4 days. Viral illness will get better without medications (antibiotics). Strep throat and other bacterial infections will usually begin to get better about 24-48 hours after you begin to take antibiotics.  HOME CARE INSTRUCTIONS    If the caregiver feels there is a bacterial infection or if there is a positive strep test, they will prescribe an antibiotic. The full course of antibiotics must be taken. If the full course of antibiotic is not taken, you or your child may become ill again. If you or your child has strep throat and do not finish all of the medication, serious heart or kidney diseases may develop.   Drink enough water and fluids to keep your urine clear or pale yellow.   Only take over-the-counter or prescription medicines for pain, discomfort or fever as directed by your caregiver.   Get lots of rest.   Gargle with salt water ( tsp. of salt in a glass of water) as often as every 1-2 hours as you need for comfort.   Hard candies may soothe the throat if individual is not at risk for choking. Throat sprays or lozenges may also be used.  SEEK MEDICAL CARE IF:    Large, tender lumps in the neck develop.   A rash develops.   Green, yellow-brown or bloody sputum is coughed up.   Your baby is older than 3 months with a rectal temperature of 100.5 F (38.1 C) or higher for more than 1 day.  SEEK IMMEDIATE MEDICAL CARE IF:    A  stiff neck develops.   You or your child are drooling or unable to swallow liquids.   You or your child are vomiting, unable to keep medications or liquids down.   You or your child has severe pain, unrelieved with recommended medications.   You or your child are having difficulty breathing (not due to stuffy nose).   You or your child are unable to fully open your mouth.   You or your child develop redness, swelling, or severe pain anywhere on the neck.   You have a fever.   Your baby is older than 3 months with a rectal temperature of 102 F (38.9 C) or higher.   Your baby is 3 months old or younger with a rectal temperature of 100.4 F (38 C) or higher.  MAKE SURE YOU:    Understand these instructions.   Will watch your condition.   Will get help right away if you are not doing well or get worse.  Document Released: 05/04/2005 Document Revised: 04/23/2011 Document Reviewed: 08/01/2007  [employer] Patient Information 2012 [employer]  Bronchitis  Bronchitis is the body's way of reacting to injury and/or infection (inflammation) of the bronchi. Bronchi are the air tubes that extend from the windpipe into the lungs. If the inflammation becomes severe, it may cause shortness of breath.  CAUSES   Inflammation may be caused by:     A virus.   Germs (bacteria).   Dust.   Allergens.   Pollutants and many other irritants.  The cells lining the bronchial tree are covered with tiny hairs (cilia). These constantly beat upward, away from the lungs, toward the mouth. This keeps the lungs free of pollutants. When these cells become too irritated and are unable to do their job, mucus begins to develop. This causes the characteristic cough of bronchitis. The cough clears the lungs when the cilia are unable to do their job. Without either of these protective mechanisms, the mucus would settle in the lungs. Then you would develop pneumonia.  Smoking is a common cause of bronchitis and can contribute to  pneumonia. Stopping this habit is the single most important thing you can do to help yourself.  TREATMENT    Your caregiver may prescribe an antibiotic if the cough is caused by bacteria. Also, medicines that open up your airways make it easier to breathe. Your caregiver may also recommend or prescribe an expectorant. It will loosen the mucus to be coughed up. Only take over-the-counter or prescription medicines for pain, discomfort, or fever as directed by your caregiver.   Removing whatever causes the problem (smoking, for example) is critical to preventing the problem from getting worse.   Cough suppressants may be prescribed for relief of cough symptoms.   Inhaled medicines may be prescribed to help with symptoms now and to help prevent problems from returning.   For those with recurrent (chronic) bronchitis, there may be a need for steroid medicines.  SEEK IMMEDIATE MEDICAL CARE IF:    During treatment, you develop more pus-like mucus (purulent sputum).   You have a fever.   Your baby is older than 3 months with a rectal temperature of 102 F (38.9 C) or higher.   Your baby is 3 months old or younger with a rectal temperature of 100.4 F (38 C) or higher.   You become progressively more ill.   You have increased difficulty breathing, wheezing, or shortness of breath.  It is necessary to seek immediate medical care if you are elderly or sick from any other disease.  MAKE SURE YOU:    Understand these instructions.   Will watch your condition.   Will get help right away if you are not doing well or get worse.  Document Released: 05/04/2005 Document Revised: 04/23/2011 Document Reviewed: 03/13/2008  [employer] Patient Information 2012 [employer]

## 2011-10-30 ENCOUNTER — Ambulatory Visit (INDEPENDENT_AMBULATORY_CARE_PROVIDER_SITE_OTHER): Payer: BC Managed Care – PPO | Admitting: Family Medicine

## 2011-10-30 ENCOUNTER — Encounter: Payer: Self-pay | Admitting: Family Medicine

## 2011-10-30 VITALS — BP 138/88 | HR 80 | Temp 97.6°F | Ht 71.0 in | Wt 184.0 lb

## 2011-10-30 DIAGNOSIS — M542 Cervicalgia: Secondary | ICD-10-CM

## 2011-10-30 DIAGNOSIS — J209 Acute bronchitis, unspecified: Secondary | ICD-10-CM

## 2011-10-30 NOTE — Progress Notes (Signed)
  Subjective:    Patient ID: Clinton Cox, male    DOB: 11/28/1965, 47 y.o.   MRN: [id number]  HPI Pharyngitis/Bronchitis - Took zpack and completed this 3 days ago. Feels much better but still some chest congsetion.  On Tuesday had lost of nasal discharge that was bright yellow. Was on one side of nostril. Then next day was the other side. Now has had more clear nasal discharge. Has a spot in his throat that when he exerts himself ( like spitting) will get sudden dull pain on the right side of neck. Lasts for seconds.  Yesterday everytime swallowed it hurt.  Today it is gone.     Review of Systems     Objective:   Physical Exam  Constitutional: He is oriented to person, place, and time. He appears well-developed and well-nourished.  HENT:  Head: Normocephalic and atraumatic.  Right Ear: External ear normal.  Left Ear: External ear normal.  Nose: Nose normal.  Mouth/Throat: Oropharynx is clear and moist.       TMs and canals are clear.   Eyes: Conjunctivae and EOM are normal. Pupils are equal, round, and reactive to light.  Neck: Neck supple. No tracheal deviation present. No thyromegaly present.  Cardiovascular: Normal rate and normal heart sounds.   Pulmonary/Chest: Effort normal and breath sounds normal.  Lymphadenopathy:    He has no cervical adenopathy.  Neurological: He is alert and oriented to person, place, and time.  Skin: Skin is warm and dry.  Psychiatric: He has a normal mood and affect.          Assessment & Plan:  Acute bronchitis - Much improved. Still some residual symptoms.  Doing well. Call if suddenly gets worse. Gave him reassurance about the nasal drainage.   Neck pain - It is likey muscle spasm or could be radiating from his neck.  No lesion or nodules on exam.  He also has TMJ and impacted wisdom teeth which could be contributing.  Call if getting worse but he is better today.

## 2011-12-01 ENCOUNTER — Other Ambulatory Visit: Payer: Self-pay | Admitting: *Deleted

## 2011-12-01 MED ORDER — ALPRAZOLAM 0.5 MG PO TABS: 0.2500 mg | ORAL_TABLET | ORAL | Status: DC | PRN

## 2011-12-01 NOTE — Telephone Encounter (Signed)
Patient calls and would like a refill on his xanax. States it helps with his anxiety and sleep. Has TMJ and was up all last night with it and knows its from stress that triggers the TMJ and grinding. Please advise

## 2011-12-01 NOTE — Telephone Encounter (Signed)
Okay for 30 with no refills.

## 2011-12-16 ENCOUNTER — Other Ambulatory Visit: Payer: Self-pay | Admitting: Family Medicine

## 2011-12-27 ENCOUNTER — Other Ambulatory Visit: Payer: Self-pay | Admitting: Family Medicine

## 2012-03-16 ENCOUNTER — Other Ambulatory Visit: Payer: Self-pay | Admitting: *Deleted

## 2012-03-16 ENCOUNTER — Encounter: Payer: Self-pay | Admitting: Family Medicine

## 2012-03-16 ENCOUNTER — Ambulatory Visit (INDEPENDENT_AMBULATORY_CARE_PROVIDER_SITE_OTHER): Payer: BC Managed Care – PPO | Admitting: Family Medicine

## 2012-03-16 VITALS — BP 139/89 | HR 89 | Temp 97.7°F | Ht 71.0 in | Wt 189.0 lb

## 2012-03-16 DIAGNOSIS — H6090 Unspecified otitis externa, unspecified ear: Secondary | ICD-10-CM

## 2012-03-16 DIAGNOSIS — H60399 Other infective otitis externa, unspecified ear: Secondary | ICD-10-CM

## 2012-03-16 DIAGNOSIS — H6980 Other specified disorders of Eustachian tube, unspecified ear: Secondary | ICD-10-CM

## 2012-03-16 MED ORDER — MOMETASONE FUROATE 50 MCG/ACT NA SUSP: NASAL | Status: DC

## 2012-03-16 MED ORDER — CIPROFLOXACIN-DEXAMETHASONE 0.3-0.1 % OT SUSP: 4.0000 [drp] | Freq: Two times a day (BID) | OTIC | Status: DC

## 2012-03-16 MED ORDER — ALPRAZOLAM 0.5 MG PO TABS: 0.2500 mg | ORAL_TABLET | ORAL | Status: DC | PRN
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
## 2012-04-03 ENCOUNTER — Other Ambulatory Visit: Payer: Self-pay | Admitting: Family Medicine

## 2012-04-04 ENCOUNTER — Ambulatory Visit (INDEPENDENT_AMBULATORY_CARE_PROVIDER_SITE_OTHER): Payer: BC Managed Care – PPO | Admitting: Family Medicine

## 2012-04-04 ENCOUNTER — Encounter: Payer: Self-pay | Admitting: Family Medicine

## 2012-04-04 VITALS — BP 142/87 | HR 94 | Temp 97.8°F | Ht 71.0 in | Wt 189.0 lb

## 2012-04-04 DIAGNOSIS — H9209 Otalgia, unspecified ear: Secondary | ICD-10-CM

## 2012-04-04 DIAGNOSIS — H60399 Other infective otitis externa, unspecified ear: Secondary | ICD-10-CM

## 2012-04-04 DIAGNOSIS — H6090 Unspecified otitis externa, unspecified ear: Secondary | ICD-10-CM

## 2012-04-04 MED ORDER — HYDROCORTISONE-ACETIC ACID 1-2 % OT SOLN: 4.0000 [drp] | Freq: Three times a day (TID) | OTIC | Status: DC
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
## 2012-04-20 ENCOUNTER — Other Ambulatory Visit: Payer: Self-pay | Admitting: Family Medicine

## 2012-04-20 NOTE — Telephone Encounter (Signed)
Needs f/u appt 

## 2012-06-21 ENCOUNTER — Other Ambulatory Visit: Payer: Self-pay | Admitting: Family Medicine

## 2012-06-21 ENCOUNTER — Ambulatory Visit (INDEPENDENT_AMBULATORY_CARE_PROVIDER_SITE_OTHER): Payer: BC Managed Care – PPO

## 2012-06-21 ENCOUNTER — Encounter: Payer: Self-pay | Admitting: Family Medicine

## 2012-06-21 ENCOUNTER — Ambulatory Visit (INDEPENDENT_AMBULATORY_CARE_PROVIDER_SITE_OTHER): Payer: BC Managed Care – PPO | Admitting: Family Medicine

## 2012-06-21 ENCOUNTER — Telehealth: Payer: Self-pay | Admitting: Family Medicine

## 2012-06-21 VITALS — BP 141/90 | HR 87 | Wt 194.0 lb

## 2012-06-21 DIAGNOSIS — R079 Chest pain, unspecified: Secondary | ICD-10-CM

## 2012-06-21 DIAGNOSIS — R0781 Pleurodynia: Secondary | ICD-10-CM

## 2012-06-21 DIAGNOSIS — M940 Chondrocostal junction syndrome [Tietze]: Secondary | ICD-10-CM

## 2012-06-21 DIAGNOSIS — I10 Essential (primary) hypertension: Secondary | ICD-10-CM

## 2012-06-21 MED ORDER — AMLODIPINE BESYLATE 5 MG PO TABS: 5.0000 mg | ORAL_TABLET | Freq: Every day | ORAL | Status: DC

## 2012-06-21 MED ORDER — DICLOFENAC SODIUM 50 MG PO TBEC: DELAYED_RELEASE_TABLET | ORAL | Status: DC
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
## 2012-06-21 NOTE — Telephone Encounter (Signed)
Pt.notified

## 2012-06-21 NOTE — Telephone Encounter (Signed)
Sue Lush, Will you please let mr. Harbaugh know his xrays were reassuring and showed no abnormalites.  I still think his pain is from costocondritis like we discussed.  He can take the diclofenac rx i gave him and should return in about two weeks after taking it easy from an exercise standpoint.

## 2012-08-31 ENCOUNTER — Ambulatory Visit (INDEPENDENT_AMBULATORY_CARE_PROVIDER_SITE_OTHER): Payer: BC Managed Care – PPO

## 2012-08-31 ENCOUNTER — Encounter: Payer: Self-pay | Admitting: Sports Medicine

## 2012-08-31 ENCOUNTER — Other Ambulatory Visit: Payer: Self-pay | Admitting: *Deleted

## 2012-08-31 ENCOUNTER — Ambulatory Visit (INDEPENDENT_AMBULATORY_CARE_PROVIDER_SITE_OTHER): Payer: BC Managed Care – PPO | Admitting: Sports Medicine

## 2012-08-31 VITALS — BP 147/97 | HR 81

## 2012-08-31 DIAGNOSIS — M545 Low back pain, unspecified: Secondary | ICD-10-CM

## 2012-08-31 DIAGNOSIS — M549 Dorsalgia, unspecified: Secondary | ICD-10-CM

## 2012-08-31 DIAGNOSIS — M439 Deforming dorsopathy, unspecified: Secondary | ICD-10-CM

## 2012-08-31 HISTORY — DX: Low back pain, unspecified: M54.50

## 2012-08-31 MED ORDER — CYCLOBENZAPRINE HCL 10 MG PO TABS: ORAL_TABLET | ORAL | Status: DC

## 2012-08-31 MED ORDER — MELOXICAM 15 MG PO TABS: ORAL_TABLET | ORAL | Status: DC

## 2012-08-31 MED ORDER — PREDNISONE 50 MG PO TABS: ORAL_TABLET | ORAL | Status: DC

## 2012-08-31 NOTE — Progress Notes (Signed)
   Subjective:    I'm seeing this patient as a consultation for:  Dr. Linford Arnold.  CC: Back pain.  HPI: Low Back pain:  This pleasant 46 room comes in with a four-day history of pain that he localizes any lower left side of his low back.  He does not recall any trauma, overuse. Pain is localized but does radiate up the paraspinal muscles predominantly on the left side. He denies any radiation down the leg. Pain is worse with flexion, and sitting for long periods of time, as well as with Valsalva. He denies any bowel or bladder changes, and denies any constitutional symptoms such as fevers or chills. No trauma. Ibuprofen does relieve his pain but only minimally so.  Past medical history, Surgical history, Family history not pertinant except as noted below, Social history, Allergies, and medications have been entered into the medical record, reviewed, and no changes needed.   Review of Systems: No headache, visual changes, nausea, vomiting, diarrhea, constipation, dizziness, abdominal pain, skin rash, fevers, chills, night sweats, weight loss, swollen lymph nodes, body aches, joint swelling, muscle aches, chest pain, shortness of breath, mood changes, visual or auditory hallucinations.   Objective:   General: Well Developed, well nourished, and in no acute distress.  Neuro/Psych: Alert and oriented x3, extra-ocular muscles intact, able to move all 4 extremities, sensation grossly intact. Skin: Warm and dry, no rashes noted.  Respiratory: Not using accessory muscles, speaking in full sentences, trachea midline.  Cardiovascular: Pulses palpable, no extremity edema. Abdomen: Does not appear distended. Back Exam:  Inspection: Unremarkable  Motion: Flexion 45 deg, Extension 45 deg, Side Bending to 45 deg bilaterally,  Rotation to 45 deg bilaterally  SLR laying: Negative  XSLR laying: Negative  Palpable tenderness: Left PSIS. FABER: negative. Sensory change: Gross sensation intact to all lumbar  and sacral dermatomes.  Reflexes: 2+ at both patellar tendons, 2+ at achilles tendons, Babinski's downgoing.  Strength at foot  Plantar-flexion: 5/5 Dorsi-flexion: 5/5 Eversion: 5/5 Inversion: 5/5  Leg strength  Quad: 5/5 Hamstring: 5/5 Hip flexor: 5/5 Hip abductors: 5/5  Gait unremarkable. Impression and Recommendations:   This case required medical decision making of moderate complexity.

## 2012-08-31 NOTE — Telephone Encounter (Signed)
rx printed

## 2012-08-31 NOTE — Telephone Encounter (Signed)
Pt is asking if we can refill his Xanax. Please advise.

## 2012-08-31 NOTE — Assessment & Plan Note (Signed)
Axilla likely discogenic. Prednisone, Mobic, Flexeril, x-rays, on rehabilitation. Return in 4 weeks, MRI if no better.

## 2012-09-01 ENCOUNTER — Other Ambulatory Visit: Payer: Self-pay | Admitting: Family Medicine

## 2012-09-01 MED ORDER — ALPRAZOLAM 0.5 MG PO TABS: 0.2500 mg | ORAL_TABLET | ORAL | Status: DC | PRN

## 2012-09-01 NOTE — Telephone Encounter (Signed)
Needs f/u before future refills  

## 2012-10-07 ENCOUNTER — Other Ambulatory Visit: Payer: Self-pay | Admitting: Family Medicine

## 2012-11-11 ENCOUNTER — Other Ambulatory Visit: Payer: Self-pay | Admitting: Family Medicine

## 2012-11-18 ENCOUNTER — Other Ambulatory Visit: Payer: Self-pay | Admitting: Family Medicine

## 2012-12-06 ENCOUNTER — Ambulatory Visit (INDEPENDENT_AMBULATORY_CARE_PROVIDER_SITE_OTHER): Payer: BC Managed Care – PPO | Admitting: Family Medicine

## 2012-12-06 ENCOUNTER — Encounter: Payer: Self-pay | Admitting: Family Medicine

## 2012-12-06 VITALS — BP 143/91 | HR 72 | Wt 193.0 lb

## 2012-12-06 DIAGNOSIS — J309 Allergic rhinitis, unspecified: Secondary | ICD-10-CM

## 2012-12-06 DIAGNOSIS — R42 Dizziness and giddiness: Secondary | ICD-10-CM

## 2012-12-06 MED ORDER — FLUTICASONE PROPIONATE 50 MCG/ACT NA SUSP: 2.0000 | Freq: Every day | NASAL | Status: DC
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
## 2012-12-29 ENCOUNTER — Encounter: Payer: Self-pay | Admitting: Family Medicine

## 2012-12-29 ENCOUNTER — Ambulatory Visit (INDEPENDENT_AMBULATORY_CARE_PROVIDER_SITE_OTHER): Payer: BC Managed Care – PPO | Admitting: Family Medicine

## 2012-12-29 VITALS — BP 148/91 | HR 79 | Ht 72.0 in | Wt 190.0 lb

## 2012-12-29 DIAGNOSIS — J3489 Other specified disorders of nose and nasal sinuses: Secondary | ICD-10-CM

## 2012-12-29 DIAGNOSIS — J340 Abscess, furuncle and carbuncle of nose: Secondary | ICD-10-CM

## 2012-12-29 DIAGNOSIS — I10 Essential (primary) hypertension: Secondary | ICD-10-CM

## 2012-12-29 DIAGNOSIS — Z0000 Encounter for general adult medical examination without abnormal findings: Secondary | ICD-10-CM

## 2012-12-29 DIAGNOSIS — H539 Unspecified visual disturbance: Secondary | ICD-10-CM

## 2012-12-29 NOTE — Patient Instructions (Addendum)
Bring in BP cuff to compare to ours for blood pressure. 2 Gram Low Sodium Diet A 2 gram sodium diet restricts the amount of sodium in the diet to no more than 2 g or 2000 mg daily. Limiting the amount of sodium is often used to help lower blood pressure. It is important if you have heart, liver, or kidney problems. Many foods contain sodium for flavor and sometimes as a preservative. When the amount of sodium in a diet needs to be low, it is important to know what to look for when choosing foods and drinks. The following includes some information and guidelines to help make it easier for you to adapt to a low sodium diet. QUICK TIPS  Do not add salt to food.  Avoid convenience items and fast food.  Choose unsalted snack foods.  Buy lower sodium products, often labeled as "lower sodium" or "no salt added."  Check food labels to learn how much sodium is in 1 serving.  When eating at a restaurant, ask that your food be prepared with less salt or none, if possible. READING FOOD LABELS FOR SODIUM INFORMATION The nutrition facts label is a good place to find how much sodium is in foods. Look for products with no more than 500 to 600 mg of sodium per meal and no more than 150 mg per serving. Remember that 2 g = 2000 mg. The food label may also list foods as:  Sodium-free: Less than 5 mg in a serving.  Very low sodium: 35 mg or less in a serving.  Low-sodium: 140 mg or less in a serving.  Light in sodium: 50% less sodium in a serving. For example, if a food that usually has 300 mg of sodium is changed to become light in sodium, it will have 150 mg of sodium.  Reduced sodium: 25% less sodium in a serving. For example, if a food that usually has 400 mg of sodium is changed to reduced sodium, it will have 300 mg of sodium. CHOOSING FOODS Grains  Avoid: Salted crackers and snack items. Some cereals, including instant hot cereals. Bread stuffing and biscuit mixes. Seasoned rice or pasta  mixes.  Choose: Unsalted snack items. Low-sodium cereals, oats, puffed wheat and rice, shredded wheat. English muffins and bread. Pasta. Meats  Avoid: Salted, canned, smoked, spiced, pickled meats, including fish and poultry. Bacon, ham, sausage, cold cuts, hot dogs, anchovies.  Choose: Low-sodium canned tuna and salmon. Fresh or frozen meat, poultry, and fish. Dairy  Avoid: Processed cheese and spreads. Cottage cheese. Buttermilk and condensed milk. Regular cheese.  Choose: Milk. Low-sodium cottage cheese. Yogurt. Sour cream. Low-sodium cheese. Fruits and Vegetables  Avoid: Regular canned vegetables. Regular canned tomato sauce and paste. Frozen vegetables in sauces. Olives. Rosita Fire. Relishes. Sauerkraut.  Choose: Low-sodium canned vegetables. Low-sodium tomato sauce and paste. Frozen or fresh vegetables. Fresh and frozen fruit. Condiments  Avoid: Canned and packaged gravies. Worcestershire sauce. Tartar sauce. Barbecue sauce. Soy sauce. Steak sauce. Ketchup. Onion, garlic, and table salt. Meat flavorings and tenderizers.  Choose: Fresh and dried herbs and spices. Low-sodium varieties of mustard and ketchup. Lemon juice. Tabasco sauce. Horseradish. SAMPLE 2 GRAM SODIUM MEAL PLAN Breakfast / Sodium (mg)  1 cup low-fat milk / 143 mg  2 slices whole-wheat toast / 270 mg  1 tbs heart-healthy margarine / 153 mg  1 hard-boiled egg / 139 mg  1 small orange / 0 mg Lunch / Sodium (mg)  1 cup raw carrots / 76 mg  cup hummus / 298 mg  1 cup low-fat milk / 143 mg   cup red grapes / 2 mg  1 whole-wheat pita bread / 356 mg Dinner / Sodium (mg)  1 cup whole-wheat pasta / 2 mg  1 cup low-sodium tomato sauce / 73 mg  3 oz lean ground beef / 57 mg  1 small side salad (1 cup raw spinach leaves,  cup cucumber,  cup yellow bell pepper) with 1 tsp olive oil and 1 tsp red wine vinegar / 25 mg Snack / Sodium (mg)  1 container low-fat vanilla yogurt / 107 mg  3 graham cracker  squares / 127 mg Nutrient Analysis  Calories: 2033  Protein: 77 g  Carbohydrate: 282 g  Fat: 72 g  Sodium: 1971 mg Document Released: 05/04/2005 Document Revised: 07/27/2011 Document Reviewed: 08/05/2009 [employer] Patient Information 2014 [place], [place].
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
## 2012-12-30 LAB — HEMOGLOBIN A1C
Hgb A1c MFr Bld: 4.9 % (ref <5.7)
Mean Plasma Glucose: 94 mg/dL (ref <117)

## 2012-12-30 LAB — LIPID PANEL
Cholesterol: 184 mg/dL (ref 0–200)
HDL: 60 mg/dL (ref >39)
Total CHOL/HDL Ratio: 3.1 Ratio
Triglycerides: 83 mg/dL (ref <150)
VLDL: 17 mg/dL (ref 0–40)

## 2012-12-30 LAB — COMPLETE METABOLIC PANEL WITH GFR
ALT: 13 U/L (ref 0–53)
AST: 16 U/L (ref 0–37)
Alkaline Phosphatase: 53 U/L (ref 39–117)
BUN: 12 mg/dL (ref 6–23)
Creat: 0.95 mg/dL (ref 0.50–1.35)

## 2012-12-30 LAB — CBC WITH DIFFERENTIAL/PLATELET
Basophils Absolute: 0 10*3/uL (ref 0.0–0.1)
Basophils Relative: 1 % (ref 0–1)
Eosinophils Absolute: 0.1 10*3/uL (ref 0.0–0.7)
Eosinophils Relative: 1 % (ref 0–5)
HCT: 44.9 % (ref 39.0–52.0)
Lymphocytes Relative: 33 % (ref 12–46)
MCH: 31.5 pg (ref 26.0–34.0)
MCHC: 35 g/dL (ref 30.0–36.0)
MCV: 90 fL (ref 78.0–100.0)
Monocytes Absolute: 0.6 10*3/uL (ref 0.1–1.0)
RDW: 13.3 % (ref 11.5–15.5)

## 2013-02-03 ENCOUNTER — Ambulatory Visit: Payer: BC Managed Care – PPO | Admitting: Family Medicine

## 2013-03-09 ENCOUNTER — Ambulatory Visit (INDEPENDENT_AMBULATORY_CARE_PROVIDER_SITE_OTHER): Payer: BC Managed Care – PPO | Admitting: Family Medicine

## 2013-03-09 ENCOUNTER — Encounter: Payer: Self-pay | Admitting: Family Medicine

## 2013-03-09 VITALS — BP 152/94 | HR 99 | Wt 188.0 lb

## 2013-03-09 DIAGNOSIS — R198 Other specified symptoms and signs involving the digestive system and abdomen: Secondary | ICD-10-CM

## 2013-03-09 DIAGNOSIS — R195 Other fecal abnormalities: Secondary | ICD-10-CM

## 2013-03-09 DIAGNOSIS — Z23 Encounter for immunization: Secondary | ICD-10-CM
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
## 2013-03-22 ENCOUNTER — Encounter: Payer: Self-pay | Admitting: Family Medicine

## 2013-03-22 DIAGNOSIS — K648 Other hemorrhoids: Secondary | ICD-10-CM

## 2013-03-22 HISTORY — DX: Other hemorrhoids: K64.8

## 2013-04-04 ENCOUNTER — Encounter: Payer: Self-pay | Admitting: Family Medicine

## 2013-04-05 ENCOUNTER — Encounter: Payer: Self-pay | Admitting: Family Medicine

## 2013-06-29 ENCOUNTER — Other Ambulatory Visit: Payer: Self-pay | Admitting: Family Medicine

## 2013-07-31 ENCOUNTER — Other Ambulatory Visit: Payer: Self-pay | Admitting: Family Medicine

## 2013-09-04 ENCOUNTER — Other Ambulatory Visit: Payer: Self-pay | Admitting: Family Medicine

## 2013-10-03 ENCOUNTER — Other Ambulatory Visit: Payer: Self-pay | Admitting: Family Medicine

## 2013-11-10 ENCOUNTER — Other Ambulatory Visit: Payer: Self-pay | Admitting: Family Medicine

## 2013-11-10 NOTE — Telephone Encounter (Signed)
Patient advised to schedule an appointment before more refills.

## 2013-12-02 ENCOUNTER — Other Ambulatory Visit: Payer: Self-pay | Admitting: Family Medicine

## 2013-12-19 ENCOUNTER — Other Ambulatory Visit: Payer: Self-pay | Admitting: Family Medicine

## 2013-12-25 ENCOUNTER — Other Ambulatory Visit: Payer: Self-pay | Admitting: Family Medicine

## 2014-01-03 ENCOUNTER — Encounter: Payer: Self-pay | Admitting: Family Medicine

## 2014-01-03 ENCOUNTER — Ambulatory Visit (INDEPENDENT_AMBULATORY_CARE_PROVIDER_SITE_OTHER): Payer: BC Managed Care – PPO | Admitting: Family Medicine

## 2014-01-03 VITALS — BP 135/86 | HR 86

## 2014-01-03 DIAGNOSIS — R194 Change in bowel habit: Secondary | ICD-10-CM

## 2014-01-03 DIAGNOSIS — Z23 Encounter for immunization: Secondary | ICD-10-CM | POA: Diagnosis not present

## 2014-01-03 DIAGNOSIS — R198 Other specified symptoms and signs involving the digestive system and abdomen: Secondary | ICD-10-CM | POA: Diagnosis not present

## 2014-01-03 DIAGNOSIS — J343 Hypertrophy of nasal turbinates: Secondary | ICD-10-CM | POA: Diagnosis not present

## 2014-01-03 DIAGNOSIS — I10 Essential (primary) hypertension: Secondary | ICD-10-CM | POA: Diagnosis not present

## 2014-01-03 DIAGNOSIS — Z0000 Encounter for general adult medical examination without abnormal findings: Secondary | ICD-10-CM

## 2014-01-03 LAB — COMPLETE METABOLIC PANEL WITH GFR
ALT: 16 U/L (ref 0–53)
AST: 18 U/L (ref 0–37)
Albumin: 4.2 g/dL (ref 3.5–5.2)
Alkaline Phosphatase: 50 U/L (ref 39–117)
BUN: 12 mg/dL (ref 6–23)
CALCIUM: 9.2 mg/dL (ref 8.4–10.5)
CHLORIDE: 101 meq/L (ref 96–112)
CO2: 27 meq/L (ref 19–32)
Creat: 0.94 mg/dL (ref 0.50–1.35)
GFR, Est Non African American: >89 mL/min
Glucose, Bld: 88 mg/dL (ref 70–99)
POTASSIUM: 4.4 meq/L (ref 3.5–5.3)
Sodium: 139 mEq/L (ref 135–145)
TOTAL PROTEIN: 6.5 g/dL (ref 6.0–8.3)
Total Bilirubin: 0.7 mg/dL (ref 0.2–1.2)

## 2014-01-03 LAB — TSH: TSH: 2.608 u[IU]/mL (ref 0.350–4.500)

## 2014-01-03 MED ORDER — CLOTRIMAZOLE-BETAMETHASONE 1-0.05 % EX CREA: 1.0000 "application " | TOPICAL_CREAM | Freq: Two times a day (BID) | CUTANEOUS | Status: DC

## 2014-01-03 MED ORDER — AMLODIPINE BESYLATE 5 MG PO TABS: ORAL_TABLET | ORAL | Status: DC

## 2014-01-03 MED ORDER — ALPRAZOLAM 0.5 MG PO TABS: 0.5000 mg | ORAL_TABLET | Freq: Every evening | ORAL | Status: DC | PRN

## 2014-01-03 MED ORDER — ACYCLOVIR 200 MG PO CAPS: 200.0000 mg | ORAL_CAPSULE | Freq: Every day | ORAL | Status: DC

## 2014-01-03 MED ORDER — FLUOXETINE HCL 20 MG PO CAPS: ORAL_CAPSULE | ORAL | Status: DC

## 2014-01-03 NOTE — Patient Instructions (Signed)
Can try Ayr nasal moisturizer Use your humidifier Can apply vaseline at bedtime to nose as well.   Start flonase 1-2 sprays in the nostril at bedtime. Can stop if causes a nosebleed.
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
## 2014-01-04 LAB — PSA: PSA: 1.09 ng/mL (ref <=4.00)

## 2014-01-04 NOTE — Progress Notes (Signed)
Quick Note:  All labs are normal. ______ 

## 2014-01-05 ENCOUNTER — Telehealth: Payer: Self-pay

## 2014-01-05 MED ORDER — FLUTICASONE PROPIONATE 50 MCG/ACT NA SUSP: 2.0000 | Freq: Every day | NASAL | Status: DC

## 2014-01-05 NOTE — Telephone Encounter (Signed)
Patient advised.

## 2014-01-05 NOTE — Telephone Encounter (Signed)
Abdulaziz states the pharmacy did not receive the nasal spray. Please advise.

## 2014-01-05 NOTE — Telephone Encounter (Signed)
Please tell him I'm sorry forgot to send prescription. It has been sent now. He should be able to pick it up this weekend.

## 2014-02-21 ENCOUNTER — Encounter: Payer: Self-pay | Admitting: Family Medicine

## 2014-02-21 ENCOUNTER — Ambulatory Visit (INDEPENDENT_AMBULATORY_CARE_PROVIDER_SITE_OTHER): Payer: BC Managed Care – PPO | Admitting: Family Medicine

## 2014-02-21 VITALS — BP 137/92 | HR 90 | Temp 97.7°F | Wt 192.0 lb

## 2014-02-21 DIAGNOSIS — R109 Unspecified abdominal pain: Secondary | ICD-10-CM

## 2014-02-21 DIAGNOSIS — K253 Acute gastric ulcer without hemorrhage or perforation: Secondary | ICD-10-CM | POA: Diagnosis not present

## 2014-02-21 MED ORDER — ESOMEPRAZOLE MAGNESIUM 40 MG PO CPDR: 40.0000 mg | DELAYED_RELEASE_CAPSULE | Freq: Every day | ORAL | Status: DC

## 2014-02-21 NOTE — Patient Instructions (Signed)

## 2014-02-21 NOTE — Progress Notes (Signed)
   Subjective:    Patient ID: Clinton Cox, male    DOB: October 20, 1965, 48 y.o.   MRN: [id number]  Abdominal Pain   Abd pain on the left side.  Radiates toward umbilicus x 3 week.  occ radiates up into the left chest.  Feels like a dull cramp. Last BM was this AM.  Fever to 101 when first started but only lasted one day, Eatting relieves his pain. No blood in the stool or urine.  No constipation. Stool were green yesterday.  No vomiting.    Review of Systems  Gastrointestinal: Positive for abdominal pain.       Objective:   Physical Exam  Constitutional: He is oriented to person, place, and time. He appears well-developed and well-nourished.  HENT:  Head: Normocephalic and atraumatic.  Eyes: Conjunctivae are normal. Pupils are equal, round, and reactive to light.  Neck: Neck supple. No thyromegaly present.  Cardiovascular: Normal rate, regular rhythm and normal heart sounds.   Pulmonary/Chest: Effort normal and breath sounds normal.  Abdominal: Soft. Bowel sounds are normal. He exhibits no distension and no mass. There is tenderness. There is no rebound and no guarding.  + TTP in the epigastrum., . LLQ and suprabcially  Lymphadenopathy:    He has no cervical adenopathy.  Neurological: He is alert and oriented to person, place, and time.  Skin: Skin is warm and dry.  Psychiatric: He has a normal mood and affect. His behavior is normal.          Assessment & Plan:  Left-sided abdominal pain-most consistent with a gastric ulcer. Recommend a reflux diet. Handout provided. Also recommend a trial of a PPI for 2 weeks. Samples of Nexium 40 mg given once a day for 2 weeks. If helping then recommend a full treatment course for 6-8 weeks. If not helping after 2 weeks and please give [place] a call back. Also call sooner if develops fever, vomiting, diarrhea or intense abdominal pain. Instructed on how to properly take the Nexium.

## 2014-03-22 ENCOUNTER — Telehealth: Payer: Self-pay

## 2014-03-22 NOTE — Telephone Encounter (Signed)
Clinton Cox called and complains of one episode of lightheadedness, sweating and some nausea lasting a few minutes earlier today. Denies chest pain, shortness of breath, vision problems, speech problems or vomiting. Patient is scheduled for Monday.   Patient advised to go to ED if he has the same symptoms again with any of the following: chest pain, arm pain or numbness, shortness of breath, vision problems, speech problems or vomiting.

## 2014-03-26 ENCOUNTER — Telehealth: Payer: Self-pay | Admitting: Family Medicine

## 2014-03-26 ENCOUNTER — Encounter: Payer: Self-pay | Admitting: Family Medicine

## 2014-03-26 ENCOUNTER — Ambulatory Visit (INDEPENDENT_AMBULATORY_CARE_PROVIDER_SITE_OTHER): Payer: BC Managed Care – PPO | Admitting: Family Medicine

## 2014-03-26 VITALS — BP 143/93 | HR 85 | Ht 72.0 in | Wt 191.0 lb

## 2014-03-26 DIAGNOSIS — K219 Gastro-esophageal reflux disease without esophagitis: Secondary | ICD-10-CM | POA: Diagnosis not present

## 2014-03-26 DIAGNOSIS — I10 Essential (primary) hypertension: Secondary | ICD-10-CM

## 2014-03-26 DIAGNOSIS — R42 Dizziness and giddiness: Secondary | ICD-10-CM | POA: Diagnosis not present

## 2014-03-26 DIAGNOSIS — R0683 Snoring: Secondary | ICD-10-CM

## 2014-03-26 MED ORDER — AMLODIPINE BESYLATE 10 MG PO TABS: ORAL_TABLET | ORAL | Status: DC

## 2014-03-26 NOTE — Progress Notes (Signed)
   Subjective:    Patient ID: Clinton Cox, male    DOB: 12-Jun-1965, 48 y.o.   MRN: [id number]  HPI C/o episdoe last week when he thought he might pass out.   Never happens when he is active, but only when he is sitting down. Happened for example in a business meeting. He was sitting and was talking. .  Doesn't happen with exercise. Says will suddenly get clammy, sweaty and then feels like might pass out.  No CP with this.  He says he didn't feel particularly stressed at the time of this happened.he went to [facility] the last time this happened checked his blood pressure was 295 systolic. Has a similar episode last July while at a business meeting.    Still getting some abdominal pain - Says the nexium does seem to help.he says as soon as he stops it than the pain returns. He actually went and got a massage and actually massaged his abdomen because he was complaining about pain and symptoms. He says that day he passes a lot of gas and air and actually felt much better than he had in quite some time. He says he has regular bowel movements daily.  Snoring.  He also wonders if he could maybe have sleep apnea. His wife reports that he snores occasionally and sometimes she has to nudge him because she's worried he is not breathing. He does complain of excess daytime sleepiness. Occasional headaches.  Review of Systems     Objective:   Physical Exam  Constitutional: He is oriented to person, place, and time. He appears well-developed and well-nourished.  HENT:  Head: Normocephalic and atraumatic.  Cardiovascular: Normal rate, regular rhythm and normal heart sounds.   Pulmonary/Chest: Effort normal and breath sounds normal.  Neurological: He is alert and oriented to person, place, and time.  Skin: Skin is warm and dry.  Psychiatric: He has a normal mood and affect. His behavior is normal.          Assessment & Plan:  Lightheaded - Will check orthostatics and EKG today.  I really think that these  may have been stressful situations. It does not happen with exercise unlikely to be a poor perfusion issue. Certainly his blood pressure was high the last time this happened and its elevated today. Recommend increasing his amlodipine to better control his blood pressures.increase amlodipine to 10 mg. Follow-up in one month.EKG today shows normal sinus rhythm with a rate of 73 bpm, normal axis, no acute ST-T wave changes.  Abdominal pain/GERD-discussed continuing the Nexium for at least 6 weeks and then tapering off every other day for couple of weeks. Also recommended daily stool softener. Even though his bowels are moving daily if he still passing a lot of gas and air that he may need a softener to help him if his stools a little bit more effectively.  Snoring-stop bang questionnaire performed today. STOP BANG QUESTIONNAIRE :  Pt is high risk of OSA he answered 6/8 positive. Will refer for sleep study.  Will find out if home study or lab study is covered with insurance. No undelying heart or pulmonary problems.   HTN - uncontrolled. Will in amlodipine to 10mg .  F/U in 1 months.

## 2014-03-26 NOTE — Patient Instructions (Signed)
Increase you amlodipine to 10mg  and follow up in 3-4 weeks.

## 2014-03-26 NOTE — Telephone Encounter (Signed)
Please call digestive [facility] and get copy of polyp path report from his colonoscopy. WE need to find out if due for repeat in 5 or 10 years.

## 2014-03-26 NOTE — Progress Notes (Signed)
STOP BANG QUESTIONNAIRE Pt is high risk of OSA he answered 6/8 positive.Clinton Cox

## 2014-03-27 NOTE — Telephone Encounter (Signed)
Cool. Please call the patient and let him know. I told him we would liket him know what we found out

## 2014-03-27 NOTE — Telephone Encounter (Signed)
I called medical records for [facility] and they will fax the colonoscopy report and path report.

## 2014-03-27 NOTE — Telephone Encounter (Signed)
Dr Glennon Hamilton recommended 10 years. He will be due again 03/22/2023

## 2014-03-28 NOTE — Telephone Encounter (Signed)
Patient advised.

## 2014-04-23 ENCOUNTER — Ambulatory Visit (INDEPENDENT_AMBULATORY_CARE_PROVIDER_SITE_OTHER): Payer: BC Managed Care – PPO | Admitting: Family Medicine

## 2014-04-23 ENCOUNTER — Encounter: Payer: Self-pay | Admitting: Family Medicine

## 2014-04-23 VITALS — BP 120/82 | HR 78 | Temp 97.3°F | Ht 72.0 in | Wt 193.0 lb

## 2014-04-23 DIAGNOSIS — I10 Essential (primary) hypertension: Secondary | ICD-10-CM

## 2014-04-23 DIAGNOSIS — R21 Rash and other nonspecific skin eruption: Secondary | ICD-10-CM

## 2014-04-23 DIAGNOSIS — K5901 Slow transit constipation: Secondary | ICD-10-CM | POA: Diagnosis not present

## 2014-04-23 DIAGNOSIS — R0683 Snoring: Secondary | ICD-10-CM

## 2014-04-23 NOTE — Progress Notes (Signed)
   Subjective:    Patient ID: Clinton Cox, male    DOB: 1966/04/03, 48 y.o.   MRN: [id number]  HPI Hypertension- Pt denies chest pain, SOB, dizziness, or heart palpitations.  Taking meds as directed w/o problems.  Denies medication side effects.    He thinks his bowels may be slow. He thinks that may actually be causing some of his occasional abdominal pain that he experiences. He says it's really more of a discomfort. His daughter uses a generic version of MiraLAX. He says he started using that as well. It does seem to help. But as soon as he quits using his bowels seem to slow down again. He denies any blood in the stool or other red flag symptoms. Next  He also has a rash on his left abdomen he noticed it about 2 weeks ago. He says very itchy. He has not tried any over-the-counter topical treatments. Next  He wants to check on his sleep study. He says he never heard back after the referral was placed.   Review of Systems     Objective:   Physical Exam  Constitutional: He is oriented to person, place, and time. He appears well-developed and well-nourished.  HENT:  Head: Normocephalic and atraumatic.  Cardiovascular: Normal rate, regular rhythm and normal heart sounds.   Pulmonary/Chest: Effort normal and breath sounds normal.  Abdominal:    Neurological: He is alert and oriented to person, place, and time.  Skin: Skin is warm and dry.  Psychiatric: He has a normal mood and affect. His behavior is normal.          Assessment & Plan:  Hypertension-blood pressure looks fantastic today. Continue current regimen. Follow-up in 6 months. Next  Constipation-we discussed that he may need to use the MiraLAX more regularly. He can even start with every other day and see if that keeps his bowels moving more normally. Make sure hydrating well and eating plenty of fiber. Next  Rash on abdomen-KOH skin scraping performed. We will call with results once available. Next  Snoring-we  will check on the status of the sleep study referral.

## 2014-04-25 ENCOUNTER — Other Ambulatory Visit: Payer: Self-pay | Admitting: Family Medicine

## 2014-04-25 LAB — KOH PREP: RESULT - KOH: NONE SEEN

## 2014-04-25 MED ORDER — TRIAMCINOLONE ACETONIDE 0.5 % EX OINT: 1.0000 "application " | TOPICAL_OINTMENT | Freq: Every day | CUTANEOUS | Status: DC

## 2014-04-26 ENCOUNTER — Telehealth: Payer: Self-pay | Admitting: *Deleted

## 2014-04-26 NOTE — Telephone Encounter (Signed)
Pt wanted to know about his sleep study. He hasn't heard anything about this.Maryruth Eve, [place] Crocker'

## 2014-04-30 ENCOUNTER — Telehealth: Payer: Self-pay | Admitting: Family Medicine

## 2014-04-30 DIAGNOSIS — R0683 Snoring: Secondary | ICD-10-CM

## 2014-04-30 NOTE — Telephone Encounter (Signed)
Order changed for in house.Clinton Cox [place]

## 2014-04-30 NOTE — Telephone Encounter (Signed)
Re: Home Sleep Test Insurance will only approve In Lab sleep test per Coralyn Mark at [facility].  Please change Order to In lab sleep test.  Thank you.

## 2014-07-06 ENCOUNTER — Ambulatory Visit: Payer: BC Managed Care – PPO | Admitting: Family Medicine

## 2014-07-13 ENCOUNTER — Ambulatory Visit (INDEPENDENT_AMBULATORY_CARE_PROVIDER_SITE_OTHER): Payer: BLUE CROSS/BLUE SHIELD | Admitting: Family Medicine

## 2014-07-13 ENCOUNTER — Encounter: Payer: Self-pay | Admitting: Family Medicine

## 2014-07-13 VITALS — BP 122/82 | HR 82 | Ht 72.0 in | Wt 197.0 lb

## 2014-07-13 DIAGNOSIS — I10 Essential (primary) hypertension: Secondary | ICD-10-CM

## 2014-07-13 DIAGNOSIS — N508 Other specified disorders of male genital organs: Secondary | ICD-10-CM | POA: Diagnosis not present

## 2014-07-13 DIAGNOSIS — F410 Panic disorder [episodic paroxysmal anxiety] without agoraphobia: Secondary | ICD-10-CM | POA: Diagnosis not present

## 2014-07-13 DIAGNOSIS — N5089 Other specified disorders of the male genital organs: Secondary | ICD-10-CM

## 2014-07-13 MED ORDER — AMLODIPINE BESYLATE 10 MG PO TABS: ORAL_TABLET | ORAL | Status: DC

## 2014-07-13 MED ORDER — ALPRAZOLAM 0.5 MG PO TABS: 0.5000 mg | ORAL_TABLET | Freq: Every evening | ORAL | Status: DC | PRN

## 2014-07-13 MED ORDER — FLUOXETINE HCL 20 MG PO CAPS: ORAL_CAPSULE | ORAL | Status: DC

## 2014-07-13 NOTE — Progress Notes (Signed)
   Subjective:    Patient ID: Clinton Cox, male    DOB: 09/09/1965, 49 y.o.   MRN: [id number]  HPI Hypertension- Pt denies chest pain, SOB, dizziness, or heart palpitations.  Taking meds as directed w/o problems.  Denies medication side effects.    Follow-up panic disorder- currently on fluxoetine and xanax prn.    Snoring - has sleep study set up for next week. Will await the results. Next  He also mentioned that he feels like one of his testicles is actually getting a little larger than the other. He denies any pain or irritation. But wanted to mention it to me.  Review of Systems     Objective:   Physical Exam  Constitutional: He is oriented to person, place, and time. He appears well-developed and well-nourished.  HENT:  Head: Normocephalic and atraumatic.  Cardiovascular: Normal rate, regular rhythm and normal heart sounds.   Pulmonary/Chest: Effort normal and breath sounds normal.  Neurological: He is alert and oriented to person, place, and time.  Skin: Skin is warm and dry.  Psychiatric: He has a normal mood and affect. His behavior is normal.          Assessment & Plan:  Hypertension-well controlled on repeat.  Otherwise doing well. Follow-up in 6 months.  Panic disorder-gad 7 score of 11 today. This is considered moderate anxiety but he rates his symptoms as not difficult. Refill current medications -  Change in testicle-offered to refer him to urology for further evaluation. He declined and said maybe we will check it at the next office visit.

## 2014-07-16 ENCOUNTER — Other Ambulatory Visit: Payer: Self-pay | Admitting: Family Medicine

## 2014-07-18 ENCOUNTER — Ambulatory Visit (HOSPITAL_BASED_OUTPATIENT_CLINIC_OR_DEPARTMENT_OTHER): Payer: BLUE CROSS/BLUE SHIELD | Attending: Family Medicine | Admitting: *Deleted

## 2014-07-18 VITALS — Ht 72.0 in | Wt 195.0 lb

## 2014-07-18 DIAGNOSIS — R0683 Snoring: Secondary | ICD-10-CM | POA: Diagnosis present

## 2014-07-22 DIAGNOSIS — R0683 Snoring: Secondary | ICD-10-CM

## 2014-07-22 NOTE — Sleep Study (Signed)
   NAME: Clinton Cox DATE OF BIRTH:  1965-08-01 MEDICAL RECORD NUMBER [id number]  LOCATION: [facility]  PHYSICIAN: Tarin Johndrow D  DATE OF STUDY: 07/18/2014  SLEEP STUDY TYPE: Nocturnal Polysomnogram               REFERRING PHYSICIAN: Beatrice Lecher D, *  INDICATION FOR STUDY: Hypersomnia with sleep apnea  EPWORTH SLEEPINESS SCORE:   13/24 HEIGHT: 6' (182.9 cm)  WEIGHT: 88.451 kg (195 lb)    Body mass index is 26.44 kg/(m^2).  NECK SIZE: 16.5 in.  MEDICATIONS: Charted for review  SLEEP ARCHITECTURE: Total sleep time 330.5 minutes with sleep efficiency 85.1%. Stage I was 7.3%, stage II 68.5%, stage III 0.5%, REM 23.8% of total sleep time. Sleep latency 14 minutes, REM latency 70 minutes, awake after sleep onset 43.5 minutes, arousal index 18.9, bedtime medication: Xanax  RESPIRATORY DATA: Apnea hypopnea index (AHI) 2.5 per hour. 14 total events scored including one obstructive apnea and 13 hypopneas. All events were while supine. REM AHI 0. CPAP titration was not done.  OXYGEN DATA: Moderate snoring with oxygen desaturation to a nadir of 89% and mean saturation 94.9% on room air  CARDIAC DATA: Sinus rhythm with occasional PVC  MOVEMENT/PARASOMNIA: No significant movement disturbance, no bathroom trips  IMPRESSION/ RECOMMENDATION:   1) Occasional respiratory event with sleep disturbance, within normal limits. AHI 2.5 per hour. The normal range for adults is an AHI from 0-5 events per hour. Moderate snoring with oxygen desaturation to a nadir of 89% and mean saturation 94.9% on room air. 2) Notation was made that patient reports difficulty sleeping on his sides for long. Somatic discomfort may contribute to sleep fragmentation and restless sleep.   [name] Diplomate, American Board of Sleep Medicine  ELECTRONICALLY SIGNED ON:  07/22/2014, 4:51 PM [place] PH: [phone]   FX: [phone] [place]

## 2014-08-06 ENCOUNTER — Encounter: Payer: Self-pay | Admitting: Family Medicine

## 2014-08-27 ENCOUNTER — Other Ambulatory Visit: Payer: Self-pay | Admitting: Family Medicine

## 2014-09-19 ENCOUNTER — Other Ambulatory Visit: Payer: Self-pay | Admitting: Family Medicine

## 2014-10-22 ENCOUNTER — Ambulatory Visit: Payer: BC Managed Care – PPO | Admitting: Family Medicine

## 2014-11-07 ENCOUNTER — Ambulatory Visit: Payer: BLUE CROSS/BLUE SHIELD | Admitting: Family Medicine

## 2014-11-27 ENCOUNTER — Ambulatory Visit (INDEPENDENT_AMBULATORY_CARE_PROVIDER_SITE_OTHER): Payer: BLUE CROSS/BLUE SHIELD | Admitting: Family Medicine

## 2014-11-27 ENCOUNTER — Encounter: Payer: Self-pay | Admitting: Family Medicine

## 2014-11-27 VITALS — BP 147/85 | HR 84 | Ht 72.0 in | Wt 198.0 lb

## 2014-11-27 DIAGNOSIS — F410 Panic disorder [episodic paroxysmal anxiety] without agoraphobia: Secondary | ICD-10-CM | POA: Diagnosis not present

## 2014-11-27 DIAGNOSIS — R42 Dizziness and giddiness: Secondary | ICD-10-CM

## 2014-11-27 DIAGNOSIS — Z125 Encounter for screening for malignant neoplasm of prostate: Secondary | ICD-10-CM

## 2014-11-27 DIAGNOSIS — Z114 Encounter for screening for human immunodeficiency virus [HIV]: Secondary | ICD-10-CM

## 2014-11-27 DIAGNOSIS — M7712 Lateral epicondylitis, left elbow: Secondary | ICD-10-CM

## 2014-11-27 DIAGNOSIS — E785 Hyperlipidemia, unspecified: Secondary | ICD-10-CM

## 2014-11-27 DIAGNOSIS — Z1159 Encounter for screening for other viral diseases: Secondary | ICD-10-CM

## 2014-11-27 DIAGNOSIS — I10 Essential (primary) hypertension: Secondary | ICD-10-CM

## 2014-11-27 MED ORDER — HYDROCHLOROTHIAZIDE 12.5 MG PO CAPS: 12.5000 mg | ORAL_CAPSULE | Freq: Every day | ORAL | Status: DC
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
## 2014-11-29 LAB — LIPID PANEL
Cholesterol: 183 mg/dL (ref 0–200)
HDL: 58 mg/dL (ref >=40)
LDL Cholesterol: 111 mg/dL — ABNORMAL HIGH (ref 0–99)
TRIGLYCERIDES: 71 mg/dL (ref <150)
Total CHOL/HDL Ratio: 3.2 Ratio
VLDL: 14 mg/dL (ref 0–40)

## 2014-11-29 LAB — COMPLETE METABOLIC PANEL WITH GFR
ALK PHOS: 56 U/L (ref 39–117)
ALT: 23 U/L (ref 0–53)
AST: 24 U/L (ref 0–37)
Albumin: 4 g/dL (ref 3.5–5.2)
BILIRUBIN TOTAL: 0.6 mg/dL (ref 0.2–1.2)
BUN: 8 mg/dL (ref 6–23)
CO2: 25 mEq/L (ref 19–32)
CREATININE: 0.9 mg/dL (ref 0.50–1.35)
Calcium: 9 mg/dL (ref 8.4–10.5)
Chloride: 104 mEq/L (ref 96–112)
GFR, Est Non African American: >89 mL/min
GLUCOSE: 90 mg/dL (ref 70–99)
Potassium: 4.5 mEq/L (ref 3.5–5.3)
Sodium: 141 mEq/L (ref 135–145)
Total Protein: 6.6 g/dL (ref 6.0–8.3)

## 2014-11-29 LAB — TSH: TSH: 2.601 u[IU]/mL (ref 0.350–4.500)

## 2014-11-30 LAB — PSA: PSA: 1.19 ng/mL (ref <=4.00)

## 2014-11-30 LAB — HIV ANTIBODY (ROUTINE TESTING W REFLEX): HIV: NONREACTIVE

## 2014-12-18 ENCOUNTER — Ambulatory Visit (INDEPENDENT_AMBULATORY_CARE_PROVIDER_SITE_OTHER): Payer: BLUE CROSS/BLUE SHIELD | Admitting: Family Medicine

## 2014-12-18 ENCOUNTER — Encounter: Payer: Self-pay | Admitting: Family Medicine

## 2014-12-18 VITALS — BP 132/83 | HR 88 | Ht 72.0 in | Wt 195.0 lb

## 2014-12-18 DIAGNOSIS — I10 Essential (primary) hypertension: Secondary | ICD-10-CM | POA: Diagnosis not present

## 2014-12-18 DIAGNOSIS — R42 Dizziness and giddiness: Secondary | ICD-10-CM

## 2014-12-18 DIAGNOSIS — M25561 Pain in right knee: Secondary | ICD-10-CM

## 2014-12-18 MED ORDER — MELOXICAM 15 MG PO TABS: 15.0000 mg | ORAL_TABLET | Freq: Every day | ORAL | Status: DC | PRN

## 2014-12-18 NOTE — Progress Notes (Signed)
   Subjective:    Patient ID: Clinton Cox, male    DOB: Mar 03, 1966, 49 y.o.   MRN: [id number]  HPI F/U lightheadedness and blood pressure.  We added had chlorothiazide to his regimen about a month ago to help lower his blood pressures which were elevated at the last office visit. He decided to stop the medication because he felt like it was causing palpitations. He's just been trying to hydrate more. says he wants to take the hydrochlorothiazide PRN, so he doesn't want removed from the medication list.. Starting running and exercising about 3 weeks ago.  Say the exercise in addition to the diuretic caused the palpitations.  Seems to have gotten better.    Right knee pain will get pain on the inner knee. Will throb at times.  Says will hurt if turns the knee.  Starts hurting into his run. Worse if going downhill benefit going up hill. Says flexed his knee and felt something pop. Bought a knee sleeve with velcroe straps as well.  He has also used some pharmacy version of bio freeze. Wasn't sure it really helped.     Review of Systems     Objective:   Physical Exam  Constitutional: He is oriented to person, place, and time. He appears well-developed and well-nourished.  HENT:  Head: Normocephalic and atraumatic.  Cardiovascular: Normal rate, regular rhythm and normal heart sounds.   Pulmonary/Chest: Effort normal and breath sounds normal.  Neurological: He is alert and oriented to person, place, and time.  Skin: Skin is warm and dry.  Psychiatric: He has a normal mood and affect. His behavior is normal.    Right knee with normal flexion and extension. No swelling or erythema. Mildly Tender to touch along the medial joint line. No significant crepitus. No increased laxity with anterior drawer. Negative McMurray's. 1 I will my fingertips over the has anserine tendon does re-create his tenderness. Knee strength is 5 out of 5 as well as ankle strength.      Assessment & Plan:   Lightheadedness-resolved.  Hypertension-off of the hydrochlorothiazide and doing well with just amlodipine today. For some reasonable pressure does seem to be better. We'll continue to monitor carefully. Follow back up in 4 months. Next  Right knee pain-most consistent with has anserine tendinitis. Though also consider patellofemoral syndrome. Discussed potential diagnosis and treatment. Recommend an anti-inflammatory over the next 5-7 days. Stop immediately if any GI upset or irritation. Did encourage him to wear the knee brace that he bought. I actually think this is a good one to help support the knee. He recently got new tennis shoes which provides some better support and he actually reports that his foot pain has been better. Given handout on some stretches to do on his own at home. If not improving over the next 3-4 weeks and encouraged him to follow-up with one of our sports medicine providers. Okay to use something like bio freeze.

## 2015-01-01 ENCOUNTER — Ambulatory Visit (INDEPENDENT_AMBULATORY_CARE_PROVIDER_SITE_OTHER): Payer: BLUE CROSS/BLUE SHIELD

## 2015-01-01 ENCOUNTER — Encounter: Payer: Self-pay | Admitting: Family Medicine

## 2015-01-01 ENCOUNTER — Ambulatory Visit (INDEPENDENT_AMBULATORY_CARE_PROVIDER_SITE_OTHER): Payer: BLUE CROSS/BLUE SHIELD | Admitting: Family Medicine

## 2015-01-01 VITALS — BP 145/90 | HR 78 | Wt 196.0 lb

## 2015-01-01 DIAGNOSIS — M25562 Pain in left knee: Secondary | ICD-10-CM | POA: Diagnosis not present

## 2015-01-01 DIAGNOSIS — M25561 Pain in right knee: Secondary | ICD-10-CM | POA: Insufficient documentation

## 2015-01-01 HISTORY — DX: Pain in right knee: M25.561

## 2015-01-01 NOTE — Assessment & Plan Note (Signed)
Concern for medial compartment DJD versus medial meniscus injury. Steroid injection today. Continue meloxicam and rest. X-ray of the knee pending. Return in about a month. Recommend against running at this time.

## 2015-01-01 NOTE — Patient Instructions (Signed)
Thank you for coming in today. Continue mobic for pain as needed.  Call or go to the ER if you develop a large red swollen joint with extreme pain or oozing puss.  Return in 2-4 weeks.   Osteoarthritis Osteoarthritis is a disease that causes soreness and inflammation of a joint. It occurs when the cartilage at the affected joint wears down. Cartilage acts as a cushion, covering the ends of bones where they meet to form a joint. Osteoarthritis is the most common form of arthritis. It often occurs in older people. The joints affected most often by this condition include those in the:  Ends of the fingers.  Thumbs.  Neck.  Lower back.  Knees.  Hips. CAUSES  Over time, the cartilage that covers the ends of bones begins to wear away. This causes bone to rub on bone, producing pain and stiffness in the affected joints.  RISK FACTORS Certain factors can increase your chances of having osteoarthritis, including:  Older age.  Excessive body weight.  Overuse of joints.  Previous joint injury. SIGNS AND SYMPTOMS   Pain, swelling, and stiffness in the joint.  Over time, the joint may lose its normal shape.  Small deposits of bone (osteophytes) may grow on the edges of the joint.  Bits of bone or cartilage can break off and float inside the joint space. This may cause more pain and damage. DIAGNOSIS  Your health care provider will do a physical exam and ask about your symptoms. Various tests may be ordered, such as:  X-rays of the affected joint.  An MRI scan.  Blood tests to rule out other types of arthritis.  Joint fluid tests. This involves using a needle to draw fluid from the joint and examining the fluid under a microscope. TREATMENT  Goals of treatment are to control pain and improve joint function. Treatment plans may include:  A prescribed exercise program that allows for rest and joint relief.  A weight control plan.  Pain relief techniques, such as:  Properly  applied heat and cold.  Electric pulses delivered to nerve endings under the skin (transcutaneous electrical nerve stimulation [TENS]).  Massage.  Certain nutritional supplements.  Medicines to control pain, such as:  Acetaminophen.  Nonsteroidal anti-inflammatory drugs (NSAIDs), such as naproxen.  Narcotic or central-acting agents, such as tramadol.  Corticosteroids. These can be given orally or as an injection.  Surgery to reposition the bones and relieve pain (osteotomy) or to remove loose pieces of bone and cartilage. Joint replacement may be needed in advanced states of osteoarthritis. HOME CARE INSTRUCTIONS   Take medicines only as directed by your health care provider.  Maintain a healthy weight. Follow your health care provider's instructions for weight control. This may include dietary instructions.  Exercise as directed. Your health care provider can recommend specific types of exercise. These may include:  Strengthening exercises. These are done to strengthen the muscles that support joints affected by arthritis. They can be performed with weights or with exercise bands to add resistance.  Aerobic activities. These are exercises, such as brisk walking or low-impact aerobics, that get your heart pumping.  Range-of-motion activities. These keep your joints limber.  Balance and agility exercises. These help you maintain daily living skills.  Rest your affected joints as directed by your health care provider.  Keep all follow-up visits as directed by your health care provider. SEEK MEDICAL CARE IF:   Your skin turns red.  You develop a rash in addition to your joint  pain.  You have worsening joint pain.  You have a fever along with joint or muscle aches. SEEK IMMEDIATE MEDICAL CARE IF:  You have a significant loss of weight or appetite.  You have night sweats. [place] of Arthritis and Musculoskeletal and Skin Diseases:  www.niams.SouthExposed.es  [employer] on Aging: http://kim-miller.com/  American College of Rheumatology: www.rheumatology.org Document Released: 05/04/2005 Document Revised: 09/18/2013 Document Reviewed: 01/09/2013 [facility] Patient Information 2015 [place], [place]. This information is not intended to replace advice given to you by your health care provider. Make sure you discuss any questions you have with your health care provider.

## 2015-01-01 NOTE — Progress Notes (Signed)
Quick Note:  Xray does not show a lot of arthritis. We can get a MRI in the near future to evaluate the meniscus if you do not get better. ______

## 2015-01-01 NOTE — Progress Notes (Signed)
   Subjective:    I'm seeing this patient as a consultation for:  Dr. Madilyn Fireman  CC: Right knee pain  HPI: Patient notes a 6 week history of right knee pain. The pain is located on the medial aspect of the knee is associated with popping occasionally. Patient denies any significant swelling. He notes the pain worsened when he started doing a running program. He would like to run most mornings of the week however the pain limits his ability to run more than 1 mile. Additionally he notes considerable pain when he does some stairs following a run. He's tried some meloxicam which helps some. No fevers chills nausea vomiting or diarrhea.  Past medical history, Surgical history, Family history not pertinant except as noted below, Social history, Allergies, and medications have been entered into the medical record, reviewed, and no changes needed.   Review of Systems: No headache, visual changes, nausea, vomiting, diarrhea, constipation, dizziness, abdominal pain, skin rash, fevers, chills, night sweats, weight loss, swollen lymph nodes, body aches, joint swelling, muscle aches, chest pain, shortness of breath, mood changes, visual or auditory hallucinations.   Objective:    Filed Vitals:   01/01/15 1050  BP: 145/90  Pulse: 78    General: Well Developed, well nourished, and in no acute distress.  Neuro/Psych: Alert and oriented x3, extra-ocular muscles intact, able to move all 4 extremities, sensation grossly intact. Skin: Warm and dry, no rashes noted.  Respiratory: Not using accessory muscles, speaking in full sentences, trachea midline.  Cardiovascular: Pulses palpable, no extremity edema. Abdomen: Does not appear distended. MSK: Right knee normal-appearing no significant effusion. Range of motion 0-120 with 1+ retropatellar crepitations. Tender palpation medial joint line. Nontender otherwise. Positive medial McMurray's test negative bilaterally. Negative valgus and varus  stress. Negative Lachman's and anterior drawer test  Procedure: Real-time Ultrasound Guided Injection of right knee  Device: GE Logiq E  Images permanently stored and available for review in the ultrasound unit. Verbal informed consent obtained. Discussed risks and benefits of procedure. Warned about infection bleeding damage to structures skin hypopigmentation and fat atrophy among others. Patient expresses understanding and agreement Time-out conducted.  Noted no overlying erythema, induration, or other signs of local infection.  Skin prepped in a sterile fashion.  Local anesthesia: Topical Ethyl chloride.  With sterile technique and under real time ultrasound guidance: 80 mg Kenalog and 4 mL Marcaine injected easily.  Completed without difficulty  Pain immediately resolved suggesting accurate placement of the medication.  Advised to call if fevers/chills, erythema, induration, drainage, or persistent bleeding.  Images permanently stored and available for review in the ultrasound unit.  Impression: Technically successful ultrasound guided injection.     No results found for this or any previous visit (from the past 24 hour(s)). No results found.  Impression and Recommendations:   This case required medical decision making of moderate complexity.

## 2015-02-06 ENCOUNTER — Encounter: Payer: Self-pay | Admitting: Family Medicine

## 2015-02-06 ENCOUNTER — Ambulatory Visit (INDEPENDENT_AMBULATORY_CARE_PROVIDER_SITE_OTHER): Payer: BLUE CROSS/BLUE SHIELD | Admitting: Family Medicine

## 2015-02-06 VITALS — BP 134/87 | HR 72 | Ht 72.0 in | Wt 193.0 lb

## 2015-02-06 DIAGNOSIS — R002 Palpitations: Secondary | ICD-10-CM

## 2015-02-06 LAB — BASIC METABOLIC PANEL
BUN: 8 mg/dL (ref 7–25)
CALCIUM: 9.3 mg/dL (ref 8.6–10.3)
CO2: 29 mmol/L (ref 20–31)
CREATININE: 0.86 mg/dL (ref 0.60–1.35)
Chloride: 101 mmol/L (ref 98–110)
GLUCOSE: 93 mg/dL (ref 65–99)
Potassium: 4.5 mmol/L (ref 3.5–5.3)
Sodium: 140 mmol/L (ref 135–146)

## 2015-02-06 LAB — TSH: TSH: 2.927 u[IU]/mL (ref 0.350–4.500)

## 2015-02-06 NOTE — Patient Instructions (Signed)
Thank you for coming in today. We will call you with the time and place to get the holter monitor.  Return a week after you turn in the monitor to discuss results.  Get labs today.  Call or go to the emergency room if you get worse, have trouble breathing, have chest pains, or palpitations.  Continue to exercise.   Palpitations A palpitation is the feeling that your heartbeat is irregular or is faster than normal. It may feel like your heart is fluttering or skipping a beat. Palpitations are usually not a serious problem. However, in some cases, you may need further medical evaluation. CAUSES  Palpitations can be caused by:  Smoking.  Caffeine or other stimulants, such as diet pills or energy drinks.  Alcohol.  Stress and anxiety.  Strenuous physical activity.  Fatigue.  Certain medicines.  Heart disease, especially if you have a history of irregular heart rhythms (arrhythmias), such as atrial fibrillation, atrial flutter, or supraventricular tachycardia.  An improperly working pacemaker or defibrillator. DIAGNOSIS  To find the cause of your palpitations, your health care provider will take your medical history and perform a physical exam. Your health care provider may also have you take a test called an ambulatory electrocardiogram (ECG). An ECG records your heartbeat patterns over a 24-hour period. You may also have other tests, such as:  Transthoracic echocardiogram (TTE). During echocardiography, sound waves are used to evaluate how blood flows through your heart.  Transesophageal echocardiogram (TEE).  Cardiac monitoring. This allows your health care provider to monitor your heart rate and rhythm in real time.  Holter monitor. This is a portable device that records your heartbeat and can help diagnose heart arrhythmias. It allows your health care provider to track your heart activity for several days, if needed.  Stress tests by exercise or by giving medicine that makes  the heart beat faster. TREATMENT  Treatment of palpitations depends on the cause of your symptoms and can vary greatly. Most cases of palpitations do not require any treatment other than time, relaxation, and monitoring your symptoms. Other causes, such as atrial fibrillation, atrial flutter, or supraventricular tachycardia, usually require further treatment. HOME CARE INSTRUCTIONS   Avoid:  Caffeinated coffee, tea, soft drinks, diet pills, and energy drinks.  Chocolate.  Alcohol.  Stop smoking if you smoke.  Reduce your stress and anxiety. Things that can help you relax include:  A method of controlling things in your body, such as your heartbeats, with your mind (biofeedback).  Yoga.  Meditation.  Physical activity such as swimming, jogging, or walking.  Get plenty of rest and sleep. SEEK MEDICAL CARE IF:   You continue to have a fast or irregular heartbeat beyond 24 hours.  Your palpitations occur more often. SEEK IMMEDIATE MEDICAL CARE IF:  You have chest pain or shortness of breath.  You have a severe headache.  You feel dizzy or you faint. MAKE SURE YOU:  Understand these instructions.  Will watch your condition.  Will get help right away if you are not doing well or get worse. Document Released: 05/01/2000 Document Revised: 05/09/2013 Document Reviewed: 07/03/2011 [facility] Patient Information 2015 [place], [place]. This information is not intended to replace advice given to you by your health care provider. Make sure you discuss any questions you have with your health care provider.

## 2015-02-06 NOTE — Assessment & Plan Note (Signed)
Likely benign. Check Holter monitor basic metabolic panel and TSH. Return in a few weeks.
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
## 2015-02-07 ENCOUNTER — Ambulatory Visit (INDEPENDENT_AMBULATORY_CARE_PROVIDER_SITE_OTHER): Payer: BLUE CROSS/BLUE SHIELD

## 2015-02-07 ENCOUNTER — Other Ambulatory Visit: Payer: Self-pay | Admitting: Family Medicine

## 2015-02-07 DIAGNOSIS — R002 Palpitations: Secondary | ICD-10-CM | POA: Diagnosis not present

## 2015-02-07 NOTE — Progress Notes (Signed)
Quick Note:    Labs are normal.  ______

## 2015-02-13 ENCOUNTER — Telehealth: Payer: Self-pay

## 2015-02-13 NOTE — Telephone Encounter (Signed)
Clinton Cox is still having palpitations. He called for the results of his Holter monitor. Please advise.

## 2015-02-13 NOTE — Telephone Encounter (Signed)
Called pt and informed him the cardiologist had not read the results of the holter monitor and that we would call him when the results came in. Dr Georgina Snell reviewed the preliminary report and advised that it does not look like the pt is experiencing anything life threatening. Pt advised to go to the ED if he feels it necessary.  After speaking with Dr.Corey the pt reassured and agreed to wait for the official results to come in.

## 2015-02-20 ENCOUNTER — Other Ambulatory Visit: Payer: Self-pay | Admitting: Family Medicine

## 2015-02-22 NOTE — Progress Notes (Signed)
Quick Note:  Normal, no changes. ______ 

## 2015-03-13 ENCOUNTER — Encounter: Payer: Self-pay | Admitting: Osteopathic Medicine

## 2015-03-13 ENCOUNTER — Ambulatory Visit (INDEPENDENT_AMBULATORY_CARE_PROVIDER_SITE_OTHER): Payer: BLUE CROSS/BLUE SHIELD | Admitting: Osteopathic Medicine

## 2015-03-13 VITALS — BP 143/88 | HR 76 | Temp 97.7°F | Wt 191.0 lb

## 2015-03-13 DIAGNOSIS — N529 Male erectile dysfunction, unspecified: Secondary | ICD-10-CM | POA: Diagnosis not present

## 2015-03-13 DIAGNOSIS — J302 Other seasonal allergic rhinitis: Secondary | ICD-10-CM | POA: Insufficient documentation

## 2015-03-13 DIAGNOSIS — J069 Acute upper respiratory infection, unspecified: Secondary | ICD-10-CM | POA: Diagnosis not present

## 2015-03-13 HISTORY — DX: Other seasonal allergic rhinitis: J30.2

## 2015-03-13 MED ORDER — FLUTICASONE PROPIONATE 50 MCG/ACT NA SUSP: 2.0000 | Freq: Every day | NASAL | Status: DC

## 2015-03-13 MED ORDER — SILDENAFIL CITRATE 20 MG PO TABS: 20.0000 mg | ORAL_TABLET | Freq: Three times a day (TID) | ORAL | Status: DC
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
## 2015-03-15 ENCOUNTER — Other Ambulatory Visit: Payer: Self-pay | Admitting: *Deleted

## 2015-03-15 ENCOUNTER — Telehealth: Payer: Self-pay | Admitting: Family Medicine

## 2015-03-15 MED ORDER — ACYCLOVIR 200 MG PO CAPS: 200.0000 mg | ORAL_CAPSULE | Freq: Every day | ORAL | Status: DC

## 2015-03-15 MED ORDER — VALACYCLOVIR HCL 1 G PO TABS: 2000.0000 mg | ORAL_TABLET | Freq: Two times a day (BID) | ORAL | Status: DC

## 2015-03-15 NOTE — Telephone Encounter (Signed)
Called and spoken to about this and he stated that he got sick and had a fever over the past weekend. By Thursday he noticed the blister and began taking the for the medication for the fever blisters and he stated that the blisters have gotten larger and he feels that his face hurts more because of this. Pt advised to used an ice pack for comfort, and ibu. Spoke with Dr. Madilyn Fireman and she stated that she will switch him from the acyclovir to the valacyclovir this is a stronger medication.Audelia Hives [place]

## 2015-03-15 NOTE — Telephone Encounter (Signed)
Pt called.He wants to speak with a nurse concerning Rrcurring fever blister that;s worst than it has ever been. Return ph call to [phone].

## 2015-03-22 ENCOUNTER — Encounter: Payer: Self-pay | Admitting: Family Medicine

## 2015-03-22 ENCOUNTER — Ambulatory Visit: Payer: BLUE CROSS/BLUE SHIELD | Admitting: Family Medicine

## 2015-03-22 ENCOUNTER — Ambulatory Visit (INDEPENDENT_AMBULATORY_CARE_PROVIDER_SITE_OTHER): Payer: BLUE CROSS/BLUE SHIELD | Admitting: Family Medicine

## 2015-03-22 VITALS — BP 134/84 | HR 85 | Temp 98.6°F | Wt 193.0 lb

## 2015-03-22 DIAGNOSIS — Z23 Encounter for immunization: Secondary | ICD-10-CM | POA: Diagnosis not present

## 2015-03-22 DIAGNOSIS — R059 Cough, unspecified: Secondary | ICD-10-CM

## 2015-03-22 DIAGNOSIS — R05 Cough: Secondary | ICD-10-CM | POA: Diagnosis not present

## 2015-03-22 DIAGNOSIS — R5383 Other fatigue: Secondary | ICD-10-CM

## 2015-03-22 LAB — CBC WITH DIFFERENTIAL/PLATELET
BASOS ABS: 0 10*3/uL (ref 0.0–0.1)
Basophils Relative: 0 % (ref 0–1)
EOS ABS: 0.1 10*3/uL (ref 0.0–0.7)
EOS PCT: 1 % (ref 0–5)
HEMATOCRIT: 46 % (ref 39.0–52.0)
Hemoglobin: 15.4 g/dL (ref 13.0–17.0)
LYMPHS ABS: 2.3 10*3/uL (ref 0.7–4.0)
Lymphocytes Relative: 29 % (ref 12–46)
MCH: 30.1 pg (ref 26.0–34.0)
MCHC: 33.5 g/dL (ref 30.0–36.0)
MCV: 90 fL (ref 78.0–100.0)
MONO ABS: 0.9 10*3/uL (ref 0.1–1.0)
MPV: 9.1 fL (ref 8.6–12.4)
Monocytes Relative: 11 % (ref 3–12)
Neutro Abs: 4.7 10*3/uL (ref 1.7–7.7)
Neutrophils Relative %: 59 % (ref 43–77)
Platelets: 381 10*3/uL (ref 150–400)
RBC: 5.11 MIL/uL (ref 4.22–5.81)
RDW: 12.6 % (ref 11.5–15.5)
WBC: 7.9 10*3/uL (ref 4.0–10.5)

## 2015-03-22 NOTE — Progress Notes (Signed)
   Subjective:    Patient ID: Clinton Cox, male    DOB: 1965-07-03, 49 y.o.   MRN: [id number]  HPI Has felt like he has ben fatigued for about 10 days. Came in around that time for URI and it was felt to be viral. Says his cough is dry and he feels like it is worse the last 3 days.  Started with fever initially but says that has resolved.  Has been clearing his throat a lot.  No ST.  Occ sputum bit it is white.  + SOB.     Review of Systems     Objective:   Physical Exam  Constitutional: He is oriented to person, place, and time. He appears well-developed and well-nourished.  HENT:  Head: Normocephalic and atraumatic.  Right Ear: External ear normal.  Left Ear: External ear normal.  Nose: Nose normal.  Mouth/Throat: Oropharynx is clear and moist.  TMs and canals are clear.   Eyes: Conjunctivae and EOM are normal. Pupils are equal, round, and reactive to light.  Neck: Neck supple. No thyromegaly present.  Cardiovascular: Normal rate and normal heart sounds.   Pulmonary/Chest: Effort normal and breath sounds normal.  Lymphadenopathy:    He has no cervical adenopathy.  Neurological: He is alert and oriented to person, place, and time.  Skin: Skin is warm and dry.  Psychiatric: He has a normal mood and affect.          Assessment & Plan:  Cough and fatigue - most likely post infectious cough based on history and exam which is very reassuring today. Tried to reassure him as well. We will go ahead and get a CBC with differential since he is very concerned and worried. If it's abnormal or if his symptoms continue to persist or if he suddenly gets worse and we'll consider further evaluation with chest x-ray.

## 2015-04-10 ENCOUNTER — Ambulatory Visit (INDEPENDENT_AMBULATORY_CARE_PROVIDER_SITE_OTHER): Payer: BLUE CROSS/BLUE SHIELD | Admitting: Family Medicine

## 2015-04-10 ENCOUNTER — Encounter: Payer: Self-pay | Admitting: Family Medicine

## 2015-04-10 ENCOUNTER — Ambulatory Visit (INDEPENDENT_AMBULATORY_CARE_PROVIDER_SITE_OTHER): Payer: BLUE CROSS/BLUE SHIELD

## 2015-04-10 VITALS — BP 143/92 | HR 100 | Temp 97.9°F | Resp 18 | Wt 191.3 lb

## 2015-04-10 DIAGNOSIS — R05 Cough: Secondary | ICD-10-CM

## 2015-04-10 DIAGNOSIS — R059 Cough, unspecified: Secondary | ICD-10-CM

## 2015-04-10 DIAGNOSIS — R0602 Shortness of breath: Secondary | ICD-10-CM | POA: Diagnosis not present

## 2015-04-10 DIAGNOSIS — F411 Generalized anxiety disorder: Secondary | ICD-10-CM

## 2015-04-10 MED ORDER — PREDNISONE 20 MG PO TABS: 40.0000 mg | ORAL_TABLET | Freq: Every day | ORAL | Status: DC

## 2015-04-10 NOTE — Progress Notes (Signed)
   Subjective:    Patient ID: Clinton Cox, male    DOB: Mar 12, 1966, 49 y.o.   MRN: [id number]  HPI Patient comes in today complaining of persistent cough. His symptoms started about 4 weeks ago. At that time he also had a low-grade fever of 101.8. He was seen by one of her other providers. He was diagnosed with a viral upper respiratory syndrome. A week later he is still not feeling better. He still has a persistent cough and feels like he's noticed some wheezing as well.  Took mucinex.  Cough is  More dry. No more fevers. Feels like it is hard t take a deep breath. No sinus sxs. He has been taking over-the-counter cough and cold medications.  Anxiety  - he does report feeling overly anxious especially the last couple of weeks. He has a history of generalized anxiety disorder. He was on Zoloft years ago after his father died but felt very flat on it. He was then on fluoxetine for about 4 years starting in 2012. His wife is here with him today and she says that she noticed a big difference when he was on the fluoxetine. His anxiety didn't seem to be as overwhelming. She says literally every day he wakes up thinking about the fact that he might have cancer and goes to bed thinking about it. Right now his 2 biggest concerns are that he might have heart failure or cancer.  Review of Systems     Objective:   Physical Exam  Constitutional: He is oriented to person, place, and time. He appears well-developed and well-nourished.  HENT:  Head: Normocephalic and atraumatic.  Right Ear: External ear normal.  Left Ear: External ear normal.  Nose: Nose normal.  Mouth/Throat: Oropharynx is clear and moist.  TMs and canals are clear.   Eyes: Conjunctivae and EOM are normal. Pupils are equal, round, and reactive to light.  Neck: Neck supple. No thyromegaly present.  Cardiovascular: Normal rate and normal heart sounds.   Pulmonary/Chest: Effort normal and breath sounds normal.  Lymphadenopathy:    He  has no cervical adenopathy.  Neurological: He is alert and oriented to person, place, and time.  Skin: Skin is warm and dry.  Psychiatric: He has a normal mood and affect.          Assessment & Plan:  Chronic cough-most consistent with postinfectious cough. There is nothing on exam consistent with any type of bacterial infection. He is afebrile and otherwise feels well. No sinus symptoms. Will get a chest x-ray since he has had a cough for over a month at this point just to rule out any type of pneumonia or fluid. They tried to reassure him today. His pulse ox was normal at 100% as well. We can try a course of prednisone to see if this would help with some of the inflammation of the bronchial tubes.  Anxiety-we had a long discussion with him and his wife today about the pros and cons of taking medication for anxiety. It sounds like it has been very helpful for him the past. His only concern about the fluoxetine is that sometimes he felt like he couldn't feel very happy or cry when he wanted to on the medication. We discussed that we could thoroughly try a different medication if he would like or restart the fluoxetine. Encouraged to think about it over the holiday weekend and call me back on Monday if he is okay with starting a medication.

## 2015-04-19 ENCOUNTER — Encounter: Payer: Self-pay | Admitting: Family Medicine

## 2015-04-19 ENCOUNTER — Ambulatory Visit (INDEPENDENT_AMBULATORY_CARE_PROVIDER_SITE_OTHER): Payer: BLUE CROSS/BLUE SHIELD | Admitting: Family Medicine

## 2015-04-19 VITALS — BP 134/84 | HR 71 | Ht 72.0 in | Wt 191.0 lb

## 2015-04-19 DIAGNOSIS — R0602 Shortness of breath: Secondary | ICD-10-CM | POA: Diagnosis not present

## 2015-04-19 DIAGNOSIS — R5383 Other fatigue: Secondary | ICD-10-CM

## 2015-04-19 DIAGNOSIS — R6889 Other general symptoms and signs: Secondary | ICD-10-CM

## 2015-04-19 DIAGNOSIS — R0989 Other specified symptoms and signs involving the circulatory and respiratory systems: Secondary | ICD-10-CM

## 2015-04-19 DIAGNOSIS — R198 Other specified symptoms and signs involving the digestive system and abdomen: Secondary | ICD-10-CM

## 2015-04-19 DIAGNOSIS — F410 Panic disorder [episodic paroxysmal anxiety] without agoraphobia: Secondary | ICD-10-CM | POA: Diagnosis not present

## 2015-04-19 DIAGNOSIS — I10 Essential (primary) hypertension: Secondary | ICD-10-CM | POA: Diagnosis not present

## 2015-04-19 MED ORDER — FLUOXETINE HCL 20 MG PO TABS: 20.0000 mg | ORAL_TABLET | Freq: Every day | ORAL | Status: DC

## 2015-04-19 NOTE — Patient Instructions (Signed)
Food Choices for Gastroesophageal Reflux Disease, Adult When you have gastroesophageal reflux disease (GERD), the foods you eat and your eating habits are very important. Choosing the right foods can help ease the discomfort of GERD. WHAT GENERAL GUIDELINES DO I NEED TO FOLLOW?  Choose fruits, vegetables, whole grains, low-fat dairy products, and low-fat meat, fish, and poultry.  Limit fats such as oils, salad dressings, butter, nuts, and avocado.  Keep a food diary to identify foods that cause symptoms.  Avoid foods that cause reflux. These may be different for different people.  Eat frequent small meals instead of three large meals each day.  Eat your meals slowly, in a relaxed setting.  Limit fried foods.  Cook foods using methods other than frying.  Avoid drinking alcohol.  Avoid drinking large amounts of liquids with your meals.  Avoid bending over or lying down until 2-3 hours after eating. WHAT FOODS ARE NOT RECOMMENDED? The following are some foods and drinks that may worsen your symptoms: Vegetables Tomatoes. Tomato juice. Tomato and spaghetti sauce. Chili peppers. Onion and garlic. Horseradish. Fruits Oranges, grapefruit, and lemon (fruit and juice). Meats High-fat meats, fish, and poultry. This includes hot dogs, ribs, ham, sausage, salami, and bacon. Dairy Whole milk and chocolate milk. Sour cream. Cream. Butter. Ice cream. Cream cheese.  Beverages Coffee and tea, with or without caffeine. Carbonated beverages or energy drinks. Condiments Hot sauce. Barbecue sauce.  Sweets/Desserts Chocolate and cocoa. Donuts. Peppermint and spearmint. Fats and Oils High-fat foods, including French fries and potato chips. Other Vinegar. Strong spices, such as black pepper, white pepper, red pepper, cayenne, curry powder, cloves, ginger, and chili powder. The items listed above may not be a complete list of foods and beverages to avoid. Contact your dietitian for more  information.   This information is not intended to replace advice given to you by your health care provider. Make sure you discuss any questions you have with your health care provider.   Document Released: 05/04/2005 Document Revised: 05/25/2014 Document Reviewed: 03/08/2013 Elsevier Interactive Patient Education 2016 [employer] Inc.  

## 2015-04-19 NOTE — Progress Notes (Signed)
   Subjective:    Patient ID: Clinton Cox, male    DOB: [birth year], 49 y.o.   MRN: [id number]  HPI Hypertension- Pt denies chest pain, SOB, dizziness, or heart palpitations.  Taking meds as directed w/o problems.  Denies medication side effects.    Chronic cough x 4 weeks- says the cough is a little better the last few days.  Says he is clearing his throat more.  Notices the throat clearing is worse after he eats.  Has been more fatigued. Had normal hemoglobin and thyroid. Also notes his hands tingle at time.   He wanted to discuss starting a medication for his anxiety as we have discussed his previous office visit. He hasn't started it yet but wanted to just discuss it again today.   Review of Systems     Objective:   Physical Exam  Constitutional: He is oriented to person, place, and time. He appears well-developed and well-nourished.  HENT:  Head: Normocephalic and atraumatic.  Cardiovascular: Normal rate, regular rhythm and normal heart sounds.   Pulmonary/Chest: Effort normal and breath sounds normal.  Neurological: He is alert and oriented to person, place, and time.  Skin: Skin is warm and dry.  Psychiatric: He has a normal mood and affect. His behavior is normal.          Assessment & Plan:  HTN - Well controlled.  Continue current regimen.  SOB - schedule spirometry in one to 2 weeks. Will workup further. There does sound like he is getting a little bit better on his own. Explained that this could also just be a prolonged viral illness.  Fatigue - He would like his vitamin D level checked. Thyroid was normal recently. CBC was normal as well..   Throat clearing - explained that especially with eating being a trigger this is possibly reflux related. recommend diet for reflux.  We reviewed foods that are good for reflux. This is not helping and consider an over-the-counter PPI trial for 2-3 weeks.  Panic disorder-I did encourage him to consider starting medication but  ultimately it's up to him. New prescription sent to the pharmacy today.

## 2015-04-20 LAB — VITAMIN D 25 HYDROXY (VIT D DEFICIENCY, FRACTURES): Vit D, 25-Hydroxy: 26 ng/mL — ABNORMAL LOW (ref 30–100)

## 2015-04-20 LAB — VITAMIN B12: Vitamin B-12: 309 pg/mL (ref 211–911)

## 2015-04-22 ENCOUNTER — Encounter: Payer: Self-pay | Admitting: Family Medicine

## 2015-04-22 DIAGNOSIS — E559 Vitamin D deficiency, unspecified: Secondary | ICD-10-CM

## 2015-04-22 HISTORY — DX: Vitamin D deficiency, unspecified: E55.9

## 2015-05-01 ENCOUNTER — Encounter: Payer: Self-pay | Admitting: Family Medicine

## 2015-05-01 ENCOUNTER — Ambulatory Visit (INDEPENDENT_AMBULATORY_CARE_PROVIDER_SITE_OTHER): Payer: BLUE CROSS/BLUE SHIELD | Admitting: Family Medicine

## 2015-05-01 VITALS — BP 133/84 | HR 82

## 2015-05-01 DIAGNOSIS — J984 Other disorders of lung: Secondary | ICD-10-CM | POA: Diagnosis not present

## 2015-05-01 DIAGNOSIS — R0602 Shortness of breath: Secondary | ICD-10-CM

## 2015-05-01 MED ORDER — ALBUTEROL SULFATE (2.5 MG/3ML) 0.083% IN NEBU
2.5000 mg | INHALATION_SOLUTION | Freq: Once | RESPIRATORY_TRACT | Status: AC
  Administered 2015-05-01: 2.5 mg via RESPIRATORY_TRACT
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
## 2015-05-20 ENCOUNTER — Encounter: Payer: Self-pay | Admitting: Family Medicine

## 2015-06-13 ENCOUNTER — Ambulatory Visit (INDEPENDENT_AMBULATORY_CARE_PROVIDER_SITE_OTHER): Payer: BLUE CROSS/BLUE SHIELD | Admitting: Internal Medicine

## 2015-06-13 ENCOUNTER — Encounter: Payer: Self-pay | Admitting: Internal Medicine

## 2015-06-13 VITALS — BP 116/88 | HR 85 | Ht 72.0 in | Wt 195.6 lb

## 2015-06-13 DIAGNOSIS — R0600 Dyspnea, unspecified: Secondary | ICD-10-CM

## 2015-06-13 HISTORY — DX: Dyspnea, unspecified: R06.00

## 2015-06-13 NOTE — Assessment & Plan Note (Addendum)
Spirometry 05/14/15  FEV1 3.05 (73%)  Ratio 86   cxr shows a definite correlation with what appears to be a very mild restrictive defect related to unusual concavity to T spine seen on lateral view but certainly no other problems identified at this point   rec reassurance/ reg exercise/ f/u prn with cpst if losing any ground with regular ex  Total time devoted to counseling  = 25/81m review case with pt/ discussion of options/alternatives/ giving and going over instructions (see avs)

## 2015-06-13 NOTE — Patient Instructions (Signed)
Your lung function is fine though you have a curvature to your spine that slightly restricts lung volumes  Keep up the running and if losing ground call [facility] at [phone] to arrange cpst

## 2015-06-13 NOTE — Progress Notes (Signed)
Subjective:     Patient ID: Clinton Cox, male   DOB: March 13, 1966      MRN: [id number]  HPI  65 yowm never smoker onset on pnds 2007 allergy eval in [place] > failed spirometry but had nl cxr / allergy to dust controlled with zyrtec but noted doe jogging with  wife so spirometry repeated and restrictive and referred to pulmonary clinic 06/13/2015 by Dr Rhae Hammock     06/13/2015 1st [facility] office visit/ Cloris Flippo   Chief Complaint  Patient presents with  . Pulmonary Consult    Referred by Dr. Suzi Roots. Pt c/o "restrictive oxygen"- 6 yrs ago had spirometry to make sure he could get allergy tested and this was abnormal.   has never really enjoyed good ex tol and has noted when jogs with wife of 7 years she does better but there is no change vs baseline   No obvious day to day or daytime variability or assoc chronic cough to suggest asthma or cp or chest tightness, subjective wheeze or overt sinus or hb symptoms. No unusual exp hx or h/o childhood pna/ asthma or knowledge of premature birth.  Sleeping ok without nocturnal  or early am exacerbation  of respiratory  c/o's or need for noct saba. Also denies any obvious fluctuation of symptoms with weather or environmental changes or other aggravating or alleviating factors except as outlined above   Current Medications, Allergies, Complete Past Medical History, Past Surgical History, Family History, and Social History were reviewed in [employer] record.  ROS  The following are not active complaints unless bolded sore throat, dysphagia, dental problems, itching, sneezing,  nasal congestion or excess/ purulent secretions, ear ache,   fever, chills, sweats, unintended wt loss, classically pleuritic or exertional cp, hemoptysis,  orthopnea pnd or leg swelling, presyncope, palpitations, abdominal pain, anorexia, nausea, vomiting, diarrhea  or change in bowel or bladder habits, change in stools or urine, dysuria,hematuria,  rash,  arthralgias, visual complaints, headache, numbness, weakness or ataxia or problems with walking or coordination,  change in mood/affect or memory.           Review of Systems     Objective:   Physical Exam    amb wm nad  Wt Readings from Last 3 Encounters:  06/13/15 195 lb 9.6 oz (88.724 kg)  04/19/15 191 lb (86.637 kg)  04/10/15 191 lb 4.8 oz (86.773 kg)    Vital signs reviewed   HEENT: nl dentition, turbinates, and oropharynx. Nl external ear canals without cough reflex   NECK :  without JVD/Nodes/TM/ nl carotid upstrokes bilaterally   LUNGS: no acc muscle use,  slt  Concave T spine upper third, chest which is clear to A and P bilaterally without cough on insp or exp maneuvers   CV:  RRR  no s3 or murmur or increase in P2, no edema   ABD:  soft and nontender with nl inspiratory excursion in the supine position. No bruits or organomegaly, bowel sounds nl  MS:  Nl gait/ ext warm without deformities, calf tenderness, cyanosis or clubbing No obvious joint restrictions   SKIN: warm and dry without lesions    NEURO:  alert, approp, nl sensorium with  no motor deficits     I personally reviewed images and agree with radiology impression as follows:  CXR:  04/10/15  No active cardiopulmonary disease - inverse thoracic kyphosis      Assessment:

## 2015-06-24 ENCOUNTER — Other Ambulatory Visit: Payer: Self-pay | Admitting: Family Medicine

## 2015-06-24 NOTE — Telephone Encounter (Signed)
Dr. Suzi Roots this patient request a refill on Acyclovir 200 mg. Is it ok to refill?  Geet Hosking,CMA

## 2015-07-08 ENCOUNTER — Ambulatory Visit (INDEPENDENT_AMBULATORY_CARE_PROVIDER_SITE_OTHER): Payer: BLUE CROSS/BLUE SHIELD | Admitting: Family Medicine

## 2015-07-08 ENCOUNTER — Encounter: Payer: Self-pay | Admitting: Family Medicine

## 2015-07-08 VITALS — BP 138/82 | HR 88 | Temp 98.9°F | Wt 196.0 lb

## 2015-07-08 DIAGNOSIS — R509 Fever, unspecified: Secondary | ICD-10-CM | POA: Diagnosis not present

## 2015-07-08 DIAGNOSIS — J029 Acute pharyngitis, unspecified: Secondary | ICD-10-CM | POA: Diagnosis not present

## 2015-07-08 DIAGNOSIS — J019 Acute sinusitis, unspecified: Secondary | ICD-10-CM

## 2015-07-08 LAB — POCT INFLUENZA A/B
INFLUENZA A, POC: NEGATIVE
INFLUENZA B, POC: NEGATIVE

## 2015-07-08 LAB — POCT RAPID STREP A (OFFICE): RAPID STREP A SCREEN: NEGATIVE

## 2015-07-08 MED ORDER — AMOXICILLIN-POT CLAVULANATE 875-125 MG PO TABS: 1.0000 | ORAL_TABLET | Freq: Two times a day (BID) | ORAL | Status: DC

## 2015-07-08 NOTE — Progress Notes (Signed)
   Subjective:    Patient ID: Clinton Cox, male    DOB: 08-02-1965, 50 y.o.   MRN: [id number]  HPI 3 days of ST. Then yesterday his face became sensitive and ST got worse.  Took some OTC cold and flu medication.  Fever to 101 last night. Started getting some drainage with dark mucous and blood in the right nose.  No HA. Had some loose stools.  He has had a mild cough as well. He was around 300 kids over the weekend.     Review of Systems     Objective:   Physical Exam  Constitutional: He is oriented to person, place, and time. He appears well-developed and well-nourished.  HENT:  Head: Normocephalic and atraumatic.  Right Ear: External ear normal.  Left Ear: External ear normal.  Nose: Nose normal.  TMs and canals are clear. Mild erythema of OP  Eyes: Conjunctivae and EOM are normal. Pupils are equal, round, and reactive to light.  Neck: Neck supple. No thyromegaly present.  Cardiovascular: Normal rate and normal heart sounds.   Pulmonary/Chest: Effort normal and breath sounds normal.  Lymphadenopathy:    He has no cervical adenopathy.  Neurological: He is alert and oriented to person, place, and time.  Skin: Skin is warm and dry.  Psychiatric: He has a normal mood and affect.       Assessment & Plan:  Acute sinusits - since throat was red rec strep testing. It was neg. Since fever, cough and runny nose rec flu testing.  With colored drainage and blood from right sinus recommend will tx with Augmentin. Call if nto better in one week.

## 2015-07-10 ENCOUNTER — Other Ambulatory Visit: Payer: Self-pay | Admitting: Family Medicine

## 2015-07-10 ENCOUNTER — Ambulatory Visit (INDEPENDENT_AMBULATORY_CARE_PROVIDER_SITE_OTHER): Payer: BLUE CROSS/BLUE SHIELD

## 2015-07-10 ENCOUNTER — Telehealth: Payer: Self-pay

## 2015-07-10 DIAGNOSIS — R05 Cough: Secondary | ICD-10-CM

## 2015-07-10 DIAGNOSIS — R059 Cough, unspecified: Secondary | ICD-10-CM

## 2015-07-10 MED ORDER — PREDNISONE 20 MG PO TABS: 40.0000 mg | ORAL_TABLET | Freq: Every day | ORAL | Status: DC

## 2015-07-10 NOTE — Telephone Encounter (Signed)
Patient advised and chest xray ordered.

## 2015-07-10 NOTE — Telephone Encounter (Signed)
Clinton Cox complains when he coughs he gets a burning in his chest. He has a productive cough. He also has fever, chills, headaches, body aches and sweats. He is concerned the antibiotic is not strong enough for bronchitis.

## 2015-07-10 NOTE — Telephone Encounter (Signed)
Lets get chest xray.

## 2015-08-06 ENCOUNTER — Other Ambulatory Visit: Payer: Self-pay | Admitting: Family Medicine

## 2015-09-11 ENCOUNTER — Other Ambulatory Visit: Payer: Self-pay | Admitting: Family Medicine

## 2015-10-16 ENCOUNTER — Other Ambulatory Visit: Payer: Self-pay | Admitting: Family Medicine

## 2015-11-20 ENCOUNTER — Other Ambulatory Visit: Payer: Self-pay | Admitting: *Deleted

## 2015-11-20 MED ORDER — FLUOXETINE HCL 20 MG PO CAPS: ORAL_CAPSULE | ORAL | Status: DC

## 2015-12-10 ENCOUNTER — Ambulatory Visit (INDEPENDENT_AMBULATORY_CARE_PROVIDER_SITE_OTHER): Payer: BLUE CROSS/BLUE SHIELD | Admitting: Family Medicine

## 2015-12-10 ENCOUNTER — Encounter: Payer: Self-pay | Admitting: Family Medicine

## 2015-12-10 VITALS — BP 144/67 | HR 104 | Ht 72.0 in | Wt 200.0 lb

## 2015-12-10 DIAGNOSIS — R635 Abnormal weight gain: Secondary | ICD-10-CM

## 2015-12-10 DIAGNOSIS — L219 Seborrheic dermatitis, unspecified: Secondary | ICD-10-CM

## 2015-12-10 DIAGNOSIS — I10 Essential (primary) hypertension: Secondary | ICD-10-CM

## 2015-12-10 DIAGNOSIS — F410 Panic disorder [episodic paroxysmal anxiety] without agoraphobia: Secondary | ICD-10-CM | POA: Diagnosis not present

## 2015-12-10 DIAGNOSIS — R10814 Left lower quadrant abdominal tenderness: Secondary | ICD-10-CM

## 2015-12-10 MED ORDER — METOPROLOL SUCCINATE ER 25 MG PO TB24: 25.0000 mg | ORAL_TABLET | Freq: Every day | ORAL | 2 refills | Status: DC

## 2015-12-10 MED ORDER — KETOCONAZOLE 2 % EX SHAM: MEDICATED_SHAMPOO | CUTANEOUS | 3 refills | Status: DC

## 2015-12-10 MED ORDER — FLUOXETINE HCL 20 MG PO CAPS: ORAL_CAPSULE | ORAL | 1 refills | Status: DC

## 2015-12-10 MED ORDER — AMLODIPINE BESYLATE 10 MG PO TABS: 10.0000 mg | ORAL_TABLET | Freq: Every day | ORAL | 1 refills | Status: DC

## 2015-12-10 NOTE — Progress Notes (Signed)
Subjective:    CC:   HPI: Hypertension- Pt denies chest pain, SOB, dizziness, or heart palpitations.  Taking meds as directed w/o problems.  Denies medication side effects.    Anxiety - on fluoxetine 20 mg as well as alprazolam as needed. He is happy with his current regimen and denies any particular stressors at this time.  He is concerned because he has gained about 10 pounds in the last 3-4 months. He's also noticed some tenderness over his mid abdomen. No blood in the stool. He has noticed that he's had a little harder time moving his bowels. No nausea or vomiting or diarrhea. No prior history of thyroid disorder. He does exercise regularly. He runs and walk at a local park.  He also complains of a small red lesion on his left forearm. He says been there for about a month or 2. He says it doesn't really bother him as far as pain or itching.  He also complains of a dry scalp. He typically uses head and shoulders. But he still continues to get a lot of dandruff and itching. He also gets scaling and flaking in his eyebrows. He has found a lotion by [place] it seems to help keep this under control.  Past medical history, Surgical history, Family history not pertinant except as noted below, Social history, Allergies, and medications have been entered into the medical record, reviewed, and corrections made.   Review of Systems: No fevers, chills, night sweats, weight loss, chest pain, or shortness of breath.   Objective:    General: Well Developed, well nourished, and in no acute distress.  Neuro: Alert and oriented x3, extra-ocular muscles intact, sensation grossly intact.  HEENT: Normocephalic, atraumatic  Skin: Warm and dry, no rashes. Cardiac: Regular rate and rhythm, no murmurs rubs or gallops, no lower extremity edema.  Respiratory: Clear to auscultation bilaterally. Not using accessory muscles, speaking in full sentences. Abd: Soft, tender in the left lower quadrant. Mildly tender in the  right lower quadrant and left upper quadrant. No rebound or guarding.   Impression and Recommendations:   HTN- Uncontrolled. We'll go ahead and consider adding metoprolol to his amlodipine. Diastolic is a little bit low today normally it runs a little high. May need to consider decreasing the amlodipine if the diastolic is still low I see him back in a month. He says he really wants to work on losing weight first. He says never been over 200 pounds until today. Continue current regimen. Follow up in 6 months.   Anxiety - continue with fluoxetine and as needed alprazolam. He is doing a good job and using that medication very sparingly.  Seborrheic dermatitis-we'll treat with ketoconazole shampoo. When I see him back in a month if he has only had minimal response and will consider adding a topical foam steroid. In the meantime did encourage him to use conditioner.  Rapid weight gain-consider checking thyroid. He wants to wait until physical next month to get blood work but we will consider adding on. The meantime encouraged him to work on diet and continue with exercise.  Left lower quadrant tenderness on exam-recommend a trial of one capful of MiraLAX at bedtime for the next 2 weeks.

## 2016-01-08 ENCOUNTER — Ambulatory Visit (INDEPENDENT_AMBULATORY_CARE_PROVIDER_SITE_OTHER): Payer: BLUE CROSS/BLUE SHIELD | Admitting: Family Medicine

## 2016-01-08 ENCOUNTER — Other Ambulatory Visit: Payer: Self-pay | Admitting: Family Medicine

## 2016-01-08 ENCOUNTER — Encounter: Payer: Self-pay | Admitting: Family Medicine

## 2016-01-08 VITALS — BP 128/78 | HR 89 | Resp 18 | Ht 72.0 in | Wt 187.0 lb

## 2016-01-08 DIAGNOSIS — K5900 Constipation, unspecified: Secondary | ICD-10-CM

## 2016-01-08 DIAGNOSIS — I10 Essential (primary) hypertension: Secondary | ICD-10-CM

## 2016-01-08 DIAGNOSIS — Z0000 Encounter for general adult medical examination without abnormal findings: Secondary | ICD-10-CM | POA: Diagnosis not present

## 2016-01-08 DIAGNOSIS — L570 Actinic keratosis: Secondary | ICD-10-CM

## 2016-01-08 DIAGNOSIS — Z23 Encounter for immunization: Secondary | ICD-10-CM | POA: Diagnosis not present

## 2016-01-08 DIAGNOSIS — N5089 Other specified disorders of the male genital organs: Secondary | ICD-10-CM

## 2016-01-08 DIAGNOSIS — N509 Disorder of male genital organs, unspecified: Secondary | ICD-10-CM

## 2016-01-08 DIAGNOSIS — K5909 Other constipation: Secondary | ICD-10-CM

## 2016-01-08 LAB — LIPID PANEL
Cholesterol: 205 mg/dL — ABNORMAL HIGH (ref 125–200)
HDL: 68 mg/dL (ref >=40)
LDL CALC: 116 mg/dL (ref <130)
TRIGLYCERIDES: 107 mg/dL (ref <150)
Total CHOL/HDL Ratio: 3 Ratio (ref <=5.0)
VLDL: 21 mg/dL (ref <30)

## 2016-01-08 LAB — COMPLETE METABOLIC PANEL WITH GFR
ALT: 17 U/L (ref 9–46)
AST: 20 U/L (ref 10–35)
Albumin: 4.5 g/dL (ref 3.6–5.1)
Alkaline Phosphatase: 54 U/L (ref 40–115)
BILIRUBIN TOTAL: 0.8 mg/dL (ref 0.2–1.2)
BUN: 10 mg/dL (ref 7–25)
CALCIUM: 9.5 mg/dL (ref 8.6–10.3)
CO2: 25 mmol/L (ref 20–31)
CREATININE: 1.03 mg/dL (ref 0.70–1.33)
Chloride: 99 mmol/L (ref 98–110)
GFR, Est Non African American: 84 mL/min (ref >=60)
Glucose, Bld: 83 mg/dL (ref 65–99)
Potassium: 4.2 mmol/L (ref 3.5–5.3)
Sodium: 138 mmol/L (ref 135–146)
TOTAL PROTEIN: 6.8 g/dL (ref 6.1–8.1)

## 2016-01-08 LAB — PSA: PSA: 0.9 ng/mL (ref <=4.0)
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
## 2016-01-08 NOTE — Patient Instructions (Signed)
Keep up a regular exercise program and make sure you are eating a healthy diet Try to eat 4 servings of dairy a day, or if you are lactose intolerant take a calcium with vitamin D daily.    

## 2016-01-09 ENCOUNTER — Other Ambulatory Visit: Payer: Self-pay | Admitting: Family Medicine

## 2016-01-09 DIAGNOSIS — N5089 Other specified disorders of the male genital organs: Secondary | ICD-10-CM

## 2016-01-10 ENCOUNTER — Ambulatory Visit (INDEPENDENT_AMBULATORY_CARE_PROVIDER_SITE_OTHER): Payer: BLUE CROSS/BLUE SHIELD

## 2016-01-10 ENCOUNTER — Ambulatory Visit: Payer: BLUE CROSS/BLUE SHIELD

## 2016-01-10 DIAGNOSIS — N509 Disorder of male genital organs, unspecified: Secondary | ICD-10-CM

## 2016-01-10 DIAGNOSIS — N5089 Other specified disorders of the male genital organs: Secondary | ICD-10-CM | POA: Diagnosis not present

## 2016-01-10 LAB — TESTOSTERONE: Testosterone: 449 ng/dL (ref 250–827)

## 2016-02-05 ENCOUNTER — Ambulatory Visit (INDEPENDENT_AMBULATORY_CARE_PROVIDER_SITE_OTHER): Payer: BLUE CROSS/BLUE SHIELD | Admitting: Sports Medicine

## 2016-02-05 ENCOUNTER — Encounter: Payer: Self-pay | Admitting: Sports Medicine

## 2016-02-05 DIAGNOSIS — M7711 Lateral epicondylitis, right elbow: Secondary | ICD-10-CM | POA: Diagnosis not present

## 2016-02-05 MED ORDER — DICLOFENAC SODIUM 75 MG PO TBEC: 75.0000 mg | DELAYED_RELEASE_TABLET | Freq: Two times a day (BID) | ORAL | 3 refills | Status: DC

## 2016-02-05 NOTE — Progress Notes (Signed)
   Subjective:    I'm seeing this patient as a consultation for:  Dr. 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 He's tried some ibuprofen which seems to help but is not doing any rehabilitation exercises.  Past medical history, Surgical history, Family history not pertinant except as noted below, Social history, Allergies, and medications have been entered into the medical record, reviewed, and no changes needed.   Review of Systems: No headache, visual changes, nausea, vomiting, diarrhea, constipation, dizziness, abdominal pain, skin rash, fevers, chills, night sweats, weight loss, swollen lymph nodes, body aches, joint swelling, muscle aches, chest pain, shortness of breath, mood changes, visual or auditory hallucinations.   Objective:   General: Well Developed, well nourished, and in no acute distress.  Neuro/Psych: Alert and oriented x3, extra-ocular muscles intact, able to move all 4 extremities, sensation grossly intact. Skin: Warm and dry, no rashes noted.  Respiratory: Not using accessory muscles, speaking in full sentences, trachea midline.  Cardiovascular: Pulses palpable, no extremity edema. Abdomen: Does not appear distended. Right Elbow: Unremarkable to inspection. Range of motion full pronation, supination, flexion, extension. Strength is full to all of the above directions Stable to varus, valgus stress. Negative moving valgus stress test. Tender to palpation at the common extensor tendon origin Ulnar nerve does not sublux. Negative cubital tunnel Tinel's.  Impression and Recommendations:   This case required medical decision making of moderate complexity.  Lateral epicondylitis, right elbow We will start with conservative measures, he already  has a counter force brace. Adding diclofenac, home rehabilitation exercises.  Return to see me in one month, injection if no better.

## 2016-02-05 NOTE — Assessment & Plan Note (Signed)
We will start with conservative measures, he already has a counter force brace. Adding diclofenac, home rehabilitation exercises.  Return to see me in one month, injection if no better.

## 2016-03-04 ENCOUNTER — Ambulatory Visit: Payer: BLUE CROSS/BLUE SHIELD | Admitting: Sports Medicine

## 2016-03-08 ENCOUNTER — Other Ambulatory Visit: Payer: Self-pay | Admitting: Family Medicine

## 2016-04-06 ENCOUNTER — Ambulatory Visit: Payer: BLUE CROSS/BLUE SHIELD | Admitting: Sports Medicine

## 2016-04-15 ENCOUNTER — Encounter: Payer: Self-pay | Admitting: Sports Medicine

## 2016-04-15 ENCOUNTER — Ambulatory Visit (INDEPENDENT_AMBULATORY_CARE_PROVIDER_SITE_OTHER): Payer: BLUE CROSS/BLUE SHIELD | Admitting: Sports Medicine

## 2016-04-15 DIAGNOSIS — M7711 Lateral epicondylitis, right elbow: Secondary | ICD-10-CM

## 2016-04-15 NOTE — Progress Notes (Signed)
  Subjective:    CC: Follow-up  HPI: Right tennis elbow: Persistent despite one month of conservative measures including tennis elbow brace, NSAIDs, rehabilitation exercises. Localized at the common extensor tendon origin, moderate, no radiation, desires injection today.  Past medical history:  Negative.  See flowsheet/record as well for more information.  Surgical history: Negative.  See flowsheet/record as well for more information.  Family history: Negative.  See flowsheet/record as well for more information.  Social history: Negative.  See flowsheet/record as well for more information.  Allergies, and medications have been entered into the medical record, reviewed, and no changes needed.   Review of Systems: No fevers, chills, night sweats, weight loss, chest pain, or shortness of breath.   Objective:    General: Well Developed, well nourished, and in no acute distress.  Neuro: Alert and oriented x3, extra-ocular muscles intact, sensation grossly intact.  HEENT: Normocephalic, atraumatic, pupils equal round reactive to light, neck supple, no masses, no lymphadenopathy, thyroid nonpalpable.  Skin: Warm and dry, no rashes. Cardiac: Regular rate and rhythm, no murmurs rubs or gallops, no lower extremity edema.  Respiratory: Clear to auscultation bilaterally. Not using accessory muscles, speaking in full sentences. Right Elbow: Unremarkable to inspection. Range of motion full pronation, supination, flexion, extension. Strength is full to all of the above directions Stable to varus, valgus stress. Negative moving valgus stress test. Palpation of the common extensor tendon origin Ulnar nerve does not sublux. Negative cubital tunnel Tinel's.  Procedure: Real-time Ultrasound Guided Injection of right common extensor tendon origin Device: GE Logiq E  Verbal informed consent obtained.  Time-out conducted.  Noted no overlying erythema, induration, or other signs of local infection.    Skin prepped in a sterile fashion.  Local anesthesia: Topical Ethyl chloride.  With sterile technique and under real time ultrasound guidance: Noted a small gap within the substance of the common extensor tendon without full-thickness tear, using a 25-gauge needle and injected medication superficial to and deep to the common extensor tendon origin for a total of 1 mL kenalog 40, 1 mL lidocaine, 1 mL Marcaine.  Completed without difficulty  Pain immediately resolved suggesting accurate placement of the medication.  Advised to call if fevers/chills, erythema, induration, drainage, or persistent bleeding.  Images permanently stored and available for review in the ultrasound unit.  Impression: Technically successful ultrasound guided injection.  Impression and Recommendations:    Lateral epicondylitis, right elbow Did not respond to conservative measures, ultrasound guided injection as above, return in one month.

## 2016-04-15 NOTE — Assessment & Plan Note (Signed)
Did not respond to conservative measures, ultrasound guided injection as above, return in one month.

## 2016-05-13 ENCOUNTER — Ambulatory Visit: Payer: BLUE CROSS/BLUE SHIELD | Admitting: Sports Medicine

## 2016-06-14 ENCOUNTER — Other Ambulatory Visit: Payer: Self-pay | Admitting: Family Medicine

## 2016-07-03 ENCOUNTER — Encounter: Payer: BLUE CROSS/BLUE SHIELD | Admitting: Family Medicine

## 2016-07-09 ENCOUNTER — Ambulatory Visit (INDEPENDENT_AMBULATORY_CARE_PROVIDER_SITE_OTHER): Payer: BLUE CROSS/BLUE SHIELD | Admitting: Family Medicine

## 2016-07-09 ENCOUNTER — Encounter: Payer: Self-pay | Admitting: Family Medicine

## 2016-07-09 VITALS — BP 122/87 | HR 80 | Ht 72.0 in | Wt 195.0 lb

## 2016-07-09 DIAGNOSIS — R202 Paresthesia of skin: Secondary | ICD-10-CM

## 2016-07-09 DIAGNOSIS — H9313 Tinnitus, bilateral: Secondary | ICD-10-CM

## 2016-07-09 DIAGNOSIS — R0789 Other chest pain: Secondary | ICD-10-CM

## 2016-07-09 LAB — CBC WITH DIFFERENTIAL/PLATELET
BASOS PCT: 1 %
Basophils Absolute: 63 cells/uL (ref 0–200)
Eosinophils Absolute: 63 cells/uL (ref 15–500)
Eosinophils Relative: 1 %
HEMATOCRIT: 44.8 % (ref 38.5–50.0)
HEMOGLOBIN: 15.4 g/dL (ref 13.2–17.1)
LYMPHS ABS: 1827 {cells}/uL (ref 850–3900)
Lymphocytes Relative: 29 %
MCH: 31.8 pg (ref 27.0–33.0)
MCHC: 34.4 g/dL (ref 32.0–36.0)
MCV: 92.4 fL (ref 80.0–100.0)
MONO ABS: 567 {cells}/uL (ref 200–950)
MPV: 8.8 fL (ref 7.5–12.5)
Monocytes Relative: 9 %
Neutro Abs: 3780 cells/uL (ref 1500–7800)
Neutrophils Relative %: 60 %
Platelets: 316 10*3/uL (ref 140–400)
RBC: 4.85 MIL/uL (ref 4.20–5.80)
RDW: 12.7 % (ref 11.0–15.0)
WBC: 6.3 10*3/uL (ref 3.8–10.8)

## 2016-07-09 LAB — COMPLETE METABOLIC PANEL WITH GFR
ALBUMIN: 4.4 g/dL (ref 3.6–5.1)
ALK PHOS: 55 U/L (ref 40–115)
ALT: 22 U/L (ref 9–46)
AST: 23 U/L (ref 10–35)
BUN: 11 mg/dL (ref 7–25)
CALCIUM: 9.2 mg/dL (ref 8.6–10.3)
CO2: 25 mmol/L (ref 20–31)
CREATININE: 0.94 mg/dL (ref 0.70–1.33)
Chloride: 102 mmol/L (ref 98–110)
GFR, Est African American: >89 mL/min (ref >=60)
GFR, Est Non African American: >89 mL/min (ref >=60)
Glucose, Bld: 93 mg/dL (ref 65–99)
Potassium: 4.3 mmol/L (ref 3.5–5.3)
Sodium: 140 mmol/L (ref 135–146)
Total Bilirubin: 0.6 mg/dL (ref 0.2–1.2)
Total Protein: 7 g/dL (ref 6.1–8.1)

## 2016-07-09 LAB — TSH: TSH: 2.04 mIU/L (ref 0.40–4.50)

## 2016-07-09 MED ORDER — KETOCONAZOLE 2 % EX SHAM: MEDICATED_SHAMPOO | CUTANEOUS | 3 refills | Status: DC

## 2016-07-09 MED ORDER — FLUOXETINE HCL 20 MG PO CAPS: 20.0000 mg | ORAL_CAPSULE | Freq: Every day | ORAL | 6 refills | Status: DC

## 2016-07-09 MED ORDER — ACYCLOVIR 200 MG PO CAPS: 400.0000 mg | ORAL_CAPSULE | Freq: Three times a day (TID) | ORAL | 99 refills | Status: DC

## 2016-07-09 MED ORDER — CLOTRIMAZOLE-BETAMETHASONE 1-0.05 % EX CREA: 1.0000 "application " | TOPICAL_CREAM | Freq: Two times a day (BID) | CUTANEOUS | 2 refills | Status: DC

## 2016-07-09 NOTE — Progress Notes (Signed)
Subjective:    CC: tingling in fingers  HPI:  51 year old male comes in today complaining of tingling in his fingers. He also feels like it happens in his toes as well. He thinks it could be coming from his back. He did have a lumbar x-ray done back in 2014, approximately 4 years ago which did show some mild straightening of the lumbar spine. He didn't have any significant changes in the bony spine.He does have a history of cervical degenerative disc disease as well.  He also complains of ringing in both ears 1 month. He says he does wear ear buds and notices that sometimes it only hurt that happens in certain rooms in the house.He does have chronic allergic rhinitis as well.  He also had midsternal chest pain on and off for about a month. He said he describes it more severe pressure sensation and can last just a few minutes. He denies any increase in reflux symptoms or burping. He says actually holding his breath makes it worse but exercise does not worsen his symptoms or trigger it.  Past medical history, Surgical history, Family history not pertinant except as noted below, Social history, Allergies, and medications have been entered into the medical record, reviewed, and corrections made.   Review of Systems: No fevers, chills, night sweats, weight loss, chest pain, or shortness of breath.   Objective:    General: Well Developed, well nourished, and in no acute distress.  Neuro: Alert and oriented x3, extra-ocular muscles intact, sensation grossly intact.  HEENT: Normocephalic, atraumatic, Oropharynx clear, TMs and canals are clear bilaterally. No significant cervical lymphadenopathy.  Skin: Warm and dry, no rashes. Cardiac: Regular rate and rhythm, no murmurs rubs or gallops, no lower extremity edema.  Respiratory: Clear to auscultation bilaterally. Not using accessory muscles, speaking in full sentences. Muskuloskeletal: Cervical spine with normal flexion extension and rotation right  and left. Decreased side bending to the left and right. Shoulders with normal range of motion and strength is 5 out of 5 in all directions. Negative empty can test. Negative Tennille's and Phalen's sign.   Impression and Recommendations:   Paresthesias-Likely related to his cervical spine. I like to put him on about 5 days of prednisone burst and then start working on some stretching and exercising of the cervical spine. If not improving then let me know and can consider getting a repeat cervical film.  Midsternal chest pain, atypical-we'll check CBC, CMP and troponin. Will call with results once available. EKG today shows rate of 77 bpm, normal sinus rhythm with no acute ST-T wave changes.  Tinnitus- likely secondary to allergic rhinitis. Ear exam itself is completely normal.

## 2016-07-10 LAB — TROPONIN I

## 2016-07-28 ENCOUNTER — Telehealth: Payer: Self-pay | Admitting: *Deleted

## 2016-07-28 DIAGNOSIS — R0789 Other chest pain: Secondary | ICD-10-CM

## 2016-07-28 NOTE — Telephone Encounter (Signed)
Pt called and lvm stating that he thought that an order was placed for him to have a stress test done. He stated that he has not heard anything since his OV and wanted to check on this.Audelia Hives [place]

## 2016-10-14 ENCOUNTER — Ambulatory Visit (INDEPENDENT_AMBULATORY_CARE_PROVIDER_SITE_OTHER): Payer: BLUE CROSS/BLUE SHIELD | Admitting: Family Medicine

## 2016-10-14 ENCOUNTER — Encounter: Payer: Self-pay | Admitting: Family Medicine

## 2016-10-14 VITALS — BP 137/69 | HR 77 | Ht 72.0 in | Wt 196.0 lb

## 2016-10-14 DIAGNOSIS — I10 Essential (primary) hypertension: Secondary | ICD-10-CM | POA: Diagnosis not present

## 2016-10-14 DIAGNOSIS — W57XXXA Bitten or stung by nonvenomous insect and other nonvenomous arthropods, initial encounter: Secondary | ICD-10-CM

## 2016-10-14 NOTE — Progress Notes (Signed)
   Subjective:    Patient ID: Clinton Cox, male    DOB: 11-26-1965, 51 y.o.   MRN: [id number]  HPI pt reports that he was bitten by something on his L leg on either Sunday or Monday. he stated that the area was itchy not as itchy now however, it has gotten bigger and redder no pain. He did not see what kind of bug bit him. He says it's raised but seems a little better. He says his wife made him come in to get it checked out.  Hypertension-he says he is also tried to go vegetarian the last month to see if it would help lower his blood pressure. He says it worked well for a friend of his so he has actually not taken his metoprolol in the last 2 days.  Review of Systems     Objective:   Physical Exam  Constitutional: He is oriented to person, place, and time. He appears well-developed and well-nourished.  HENT:  Head: Normocephalic and atraumatic.  Eyes: Conjunctivae and EOM are normal.  Cardiovascular: Normal rate.   Pulmonary/Chest: Effort normal.  Neurological: He is alert and oriented to person, place, and time.  Skin: Skin is dry. No pallor.  On the left shin he has an area of erythema that's approximately 1 0 x 0.9 cm. There is some swelling underneath with induration. No spreading erythema. The margin is well demarcated. No open wound.   Psychiatric: He has a normal mood and affect. His behavior is normal.  Vitals reviewed.         Assessment & Plan:  Bug bite - Unclear what type of insect may have actually bitten him. Today it just looks inflamed. There is some erythema in the center and it is raised just slightly. But no sign of cellulitis. Did encourage him to keep an eye on it over the next 2-3 days. If any point the redness is spreading or it's becoming more swollen or if he develops fevers or chills and please give [place] call back. Otherwise I suspect that this will resolve over the next 2 weeks. He can certainly use an over-the-counter cortisone cream for  relief.  Hypertension-well controlled though systolic blood pressure in the upper 130s. I would recommend he continue with metoprolol for now since it does put his blood pressure at a borderline level. Keep regular scheduled follow-up.

## 2016-10-20 ENCOUNTER — Ambulatory Visit (INDEPENDENT_AMBULATORY_CARE_PROVIDER_SITE_OTHER): Payer: BLUE CROSS/BLUE SHIELD | Admitting: Sports Medicine

## 2016-10-20 ENCOUNTER — Encounter: Payer: Self-pay | Admitting: Sports Medicine

## 2016-10-20 DIAGNOSIS — M7711 Lateral epicondylitis, right elbow: Secondary | ICD-10-CM

## 2016-10-20 NOTE — Progress Notes (Signed)
  Subjective:    CC: Right elbow pain  HPI: Clinton Cox is a pleasant 51 year old male, he has known lateral epicondylitis, he's responded well to injections in the past and most recent injection was almost 7 months ago. Unfortunately she's really not been doing the rehabilitation exercises. Pain is moderate, persistent, localized without radiation.  Past medical history:  Negative.  See flowsheet/record as well for more information.  Surgical history: Negative.  See flowsheet/record as well for more information.  Family history: Negative.  See flowsheet/record as well for more information.  Social history: Negative.  See flowsheet/record as well for more information.  Allergies, and medications have been entered into the medical record, reviewed, and no changes needed.   Review of Systems: No fevers, chills, night sweats, weight loss, chest pain, or shortness of breath.   Objective:    General: Well Developed, well nourished, and in no acute distress.  Neuro: Alert and oriented x3, extra-ocular muscles intact, sensation grossly intact.  HEENT: Normocephalic, atraumatic, pupils equal round reactive to light, neck supple, no masses, no lymphadenopathy, thyroid nonpalpable.  Skin: Warm and dry, no rashes. Cardiac: Regular rate and rhythm, no murmurs rubs or gallops, no lower extremity edema.  Respiratory: Clear to auscultation bilaterally. Not using accessory muscles, speaking in full sentences. Right Elbow: Unremarkable to inspection. Range of motion full pronation, supination, flexion, extension. Strength is full to all of the above directions Stable to varus, valgus stress. Negative moving valgus stress test. Tender to palpation at the common extensor tendon origin Ulnar nerve does not sublux. Negative cubital tunnel Tinel's.  Procedure: Real-time Ultrasound Guided Injection of right common extensor tendon origin Device: GE Logiq E  Verbal informed consent obtained.  Time-out  conducted.  Noted no overlying erythema, induration, or other signs of local infection.  Skin prepped in a sterile fashion.  Local anesthesia: Topical Ethyl chloride.  With sterile technique and under real time ultrasound guidance: Noted a small gap within the substance of the common extensor tendon without full-thickness tear, using a 25-gauge needle and injected medication superficial to and deep to the common extensor tendon origin for a total of 1 mL kenalog 40, 1 mL lidocaine, 1 mL Marcaine.  Completed without difficulty  Pain immediately resolved suggesting accurate placement of the medication.  Advised to call if fevers/chills, erythema, induration, drainage, or persistent bleeding.  Images permanently stored and available for review in the ultrasound unit.  Impression: Technically successful ultrasound guided injection.  Impression and Recommendations:    Lateral epicondylitis, right elbow 6-7 month response to previous injection. Repeat injection today, emphasized the need to do eccentric rehabilitation exercises specific for the common extensor tendon in the gym. Return as needed.

## 2016-10-20 NOTE — Assessment & Plan Note (Signed)
6-7 month response to previous injection. Repeat injection today, emphasized the need to do eccentric rehabilitation exercises specific for the common extensor tendon in the gym. Return as needed.

## 2016-11-16 ENCOUNTER — Ambulatory Visit (INDEPENDENT_AMBULATORY_CARE_PROVIDER_SITE_OTHER): Payer: BLUE CROSS/BLUE SHIELD | Admitting: Sports Medicine

## 2016-11-16 ENCOUNTER — Encounter: Payer: Self-pay | Admitting: Sports Medicine

## 2016-11-16 DIAGNOSIS — R42 Dizziness and giddiness: Secondary | ICD-10-CM

## 2016-11-16 DIAGNOSIS — H8113 Benign paroxysmal vertigo, bilateral: Secondary | ICD-10-CM | POA: Diagnosis not present

## 2016-11-16 DIAGNOSIS — H832X9 Labyrinthine dysfunction, unspecified ear: Secondary | ICD-10-CM | POA: Insufficient documentation

## 2016-11-16 HISTORY — DX: Dizziness and giddiness: R42

## 2016-11-16 MED ORDER — MECLIZINE HCL 25 MG PO TABS: 25.0000 mg | ORAL_TABLET | Freq: Three times a day (TID) | ORAL | 3 refills | Status: DC

## 2016-11-16 MED ORDER — PREDNISONE 50 MG PO TABS: 50.0000 mg | ORAL_TABLET | Freq: Every day | ORAL | 0 refills | Status: DC

## 2016-11-16 NOTE — Progress Notes (Signed)
  Subjective:    CC: Dizziness  HPI: This is a pleasant 51 year old male, for the past several weeks he's had episodes of dizziness that he describes more like vertigo with the room spinning, it occasionally occurs when he turns his head, or when running. He has had some ringing in his ears at baseline from years ago but this doesn't seem associated. No head trauma, no constitutional symptoms. No sensation of pressure in his head. No visual changes, no headaches.  Past medical history:  Negative.  See flowsheet/record as well for more information.  Surgical history: Negative.  See flowsheet/record as well for more information.  Family history: Negative.  See flowsheet/record as well for more information.  Social history: Negative.  See flowsheet/record as well for more information.  Allergies, and medications have been entered into the medical record, reviewed, and no changes needed.   Review of Systems: No fevers, chills, night sweats, weight loss, chest pain, or shortness of breath.   Objective:    General: Well Developed, well nourished, and in no acute distress.  Neuro: Alert and oriented x3, extra-ocular muscles intact, sensation grossly intact. Cranial nerves II through XII are intact, motor, sensory, coordinative functions are all intact. HEENT: Normocephalic, atraumatic, pupils equal round reactive to light, neck supple, no masses, no lymphadenopathy, thyroid nonpalpable. Negative Dix-Hallpike test. Skin: Warm and dry, no rashes. Cardiac: Regular rate and rhythm, no murmurs rubs or gallops, no lower extremity edema.  Respiratory: Clear to auscultation bilaterally. Not using accessory muscles, speaking in full sentences.  Impression and Recommendations:    Benign positional vertigo Negative Dix-Hallpike, meclizine every 8 hours, prednisone burst, referral to vestibular rehabilitation. Return in one month, brain MRI if no better.  I spent 25 minutes with this patient, greater than  50% was face-to-face time counseling regarding the above diagnoses

## 2016-11-16 NOTE — Assessment & Plan Note (Signed)
Negative Dix-Hallpike, meclizine every 8 hours, prednisone burst, referral to vestibular rehabilitation. Return in one month, brain MRI if no better.

## 2016-11-16 NOTE — Patient Instructions (Signed)

## 2016-11-27 ENCOUNTER — Ambulatory Visit: Payer: BLUE CROSS/BLUE SHIELD | Attending: Sports Medicine

## 2016-11-27 ENCOUNTER — Other Ambulatory Visit: Payer: Self-pay | Admitting: Sports Medicine

## 2016-11-27 DIAGNOSIS — R42 Dizziness and giddiness: Secondary | ICD-10-CM | POA: Diagnosis not present

## 2016-11-27 DIAGNOSIS — R2689 Other abnormalities of gait and mobility: Secondary | ICD-10-CM | POA: Diagnosis present

## 2016-11-27 DIAGNOSIS — H8113 Benign paroxysmal vertigo, bilateral: Secondary | ICD-10-CM
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
## 2016-11-27 NOTE — Patient Instructions (Signed)
-  Vestibular hypofunction: decreased input from inner ear

## 2016-12-01 ENCOUNTER — Ambulatory Visit: Payer: BLUE CROSS/BLUE SHIELD

## 2016-12-01 DIAGNOSIS — R2689 Other abnormalities of gait and mobility: Secondary | ICD-10-CM | POA: Diagnosis not present

## 2016-12-01 DIAGNOSIS — R42 Dizziness and giddiness: Secondary | ICD-10-CM | POA: Diagnosis not present
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
## 2016-12-01 NOTE — Patient Instructions (Addendum)
Gaze Stabilization: Tip Card  1.Target must remain in focus, not blurry, and appear stationary while head is in motion. 2.Perform exercises with small head movements (45 to either side of midline). 3.Increase speed of head motion so long as target is in focus. 4.If you wear eyeglasses, be sure you can see target through lens (therapist will give specific instructions for bifocal / progressive lenses). 5.These exercises may provoke dizziness or nausea. Work through these symptoms. If too dizzy, slow head movement slightly. Rest between each exercise. 6.Exercises demand concentration; avoid distractions. 7.For safety, perform standing exercises close to a counter, wall, corner, or next to someone.  Copyright  VHI. All rights reserved.    Gaze Stabilization: Standing Feet Apart    Feet shoulder width apart, keeping eyes on target on wall __3-5__ feet away, tilt head down 15-30 and move head side to side for __20-30__ seconds. Repeat while moving head up and down for __20-30__ seconds. Do __2-3__ sessions per day.  Copyright  VHI. All rights reserved.    Turning    Tilt head down 15-30, keep eyes on target, turn body, then move head and eyes around and back to target. Hold position until symptoms subside. Repeat __3__ times per session. Do __2__ sessions per day.  Copyright  VHI. All rights reserved.

## 2016-12-03 ENCOUNTER — Ambulatory Visit: Payer: BLUE CROSS/BLUE SHIELD | Admitting: Physical Therapy

## 2016-12-03 DIAGNOSIS — R2689 Other abnormalities of gait and mobility: Secondary | ICD-10-CM | POA: Diagnosis not present

## 2016-12-03 DIAGNOSIS — R42 Dizziness and giddiness: Secondary | ICD-10-CM | POA: Diagnosis not present

## 2016-12-05 ENCOUNTER — Encounter: Payer: Self-pay | Admitting: Physical Therapy
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
## 2016-12-17 ENCOUNTER — Ambulatory Visit (INDEPENDENT_AMBULATORY_CARE_PROVIDER_SITE_OTHER): Payer: BLUE CROSS/BLUE SHIELD | Admitting: Sports Medicine

## 2016-12-17 ENCOUNTER — Encounter: Payer: Self-pay | Admitting: Sports Medicine

## 2016-12-17 DIAGNOSIS — L559 Sunburn, unspecified: Secondary | ICD-10-CM | POA: Diagnosis not present

## 2016-12-17 DIAGNOSIS — H832X3 Labyrinthine dysfunction, bilateral: Secondary | ICD-10-CM

## 2016-12-17 MED ORDER — TRIAMCINOLONE ACETONIDE 0.5 % EX CREA: 1.0000 "application " | TOPICAL_CREAM | Freq: Two times a day (BID) | CUTANEOUS | 3 refills | Status: DC

## 2016-12-17 NOTE — Assessment & Plan Note (Signed)
Adding topical triamcinolone, he will continue to focus on adding SPF 50 or greater.

## 2016-12-17 NOTE — Progress Notes (Signed)
  Subjective:    CC: Follow-up  HPI: Dizziness: Initially diagnosed as benign positional vertigo, he went to vestibular rehabilitation, they changed the diagnosis to vestibular hypofunction. He's done extremely well, and only has intermittent symptoms. No headaches, visual changes. At this point he desires to continue with conservative measures. He is agreeable to proceed with MRI should his symptoms and relief plateau.  Sunburn: Would like some cream for this. Agrees to use SPF 50 and above from now on.  Past medical history:  Negative.  See flowsheet/record as well for more information.  Surgical history: Negative.  See flowsheet/record as well for more information.  Family history: Negative.  See flowsheet/record as well for more information.  Social history: Negative.  See flowsheet/record as well for more information.  Allergies, and medications have been entered into the medical record, reviewed, and no changes needed.   Review of Systems: No fevers, chills, night sweats, weight loss, chest pain, or shortness of breath.   Objective:    General: Well Developed, well nourished, and in no acute distress.  Neuro: Alert and oriented x3, extra-ocular muscles intact, sensation grossly intact.  HEENT: Normocephalic, atraumatic, pupils equal round reactive to light, neck supple, no masses, no lymphadenopathy, thyroid nonpalpable.  Skin: Warm and dry, no rashes. Erythematous rash, tender over the left shoulder Cardiac: Regular rate and rhythm, no murmurs rubs or gallops, no lower extremity edema.  Respiratory: Clear to auscultation bilaterally. Not using accessory muscles, speaking in full sentences.\  Impression and Recommendations:    Vestibular hypofunction Has had good results with a month of vestibular rehabilitation. Their diagnosis is vestibular hypofunction rather than benign paroxysmal positional vertigo. We discontinued meclizine, no improvement with this. He continues to  improve, if he does have a plateau of symptoms we will proceed with MRI of the brain with narrow cuts through the pons. In addition we would probably pull the trigger for tickborne serologies.  Sunburn Adding topical triamcinolone, he will continue to focus on adding SPF 50 or greater.  I spent 25 minutes with this patient, greater than 50% was face-to-face time counseling regarding the above diagnoses

## 2016-12-17 NOTE — Assessment & Plan Note (Addendum)
Has had good results with a month of vestibular rehabilitation. Their diagnosis is vestibular hypofunction rather than benign paroxysmal positional vertigo. We discontinued meclizine, no improvement with this. He continues to improve, if he does have a plateau of symptoms we will proceed with MRI of the brain with narrow cuts through the pons. In addition we would probably pull the trigger for tickborne serologies.

## 2016-12-18 ENCOUNTER — Ambulatory Visit: Payer: BLUE CROSS/BLUE SHIELD

## 2016-12-22 ENCOUNTER — Ambulatory Visit: Payer: BLUE CROSS/BLUE SHIELD | Attending: Sports Medicine

## 2016-12-22 DIAGNOSIS — R2689 Other abnormalities of gait and mobility: Secondary | ICD-10-CM | POA: Insufficient documentation

## 2016-12-22 DIAGNOSIS — R42 Dizziness and giddiness: Secondary | ICD-10-CM | POA: Diagnosis not present
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
## 2016-12-22 NOTE — Patient Instructions (Signed)
Gaze Stabilization: Tip Card  1.Target must remain in focus, not blurry, and appear stationary while head is in motion. 2.Perform exercises with small head movements (45 to either side of midline). 3.Increase speed of head motion so long as target is in focus. 4.If you wear eyeglasses, be sure you can see target through lens (therapist will give specific instructions for bifocal / progressive lenses). 5.These exercises may provoke dizziness or nausea. Work through these symptoms. If too dizzy, slow head movement slightly. Rest between each exercise. 6.Exercises demand concentration; avoid distractions. 7.For safety, perform standing exercises close to a counter, wall, corner, or next to someone.  Copyright  VHI. All rights reserved.    Gaze Stabilization: Standing Feet Apart    Feet shoulder width apart, keeping eyes on target on wall __3-5__ feet away, tilt head down 15-30 and move head side to side for __20-30__ seconds. Repeat while moving head up and down for __20-30__ seconds. Do __2-3__ sessions per day.  Copyright  VHI. All rights reserved.    Turning    Tilt head down 15-30, keep eyes on target, turn body, then move head and eyes around and back to target. Hold position until symptoms subside. Repeat __3__ times per session. Do __2__ sessions per day.  Copyright  VHI. All rights reserved.

## 2016-12-29 ENCOUNTER — Ambulatory Visit: Payer: BLUE CROSS/BLUE SHIELD | Admitting: Physical Therapy

## 2017-01-06 ENCOUNTER — Ambulatory Visit: Payer: BLUE CROSS/BLUE SHIELD

## 2017-01-06 DIAGNOSIS — R2689 Other abnormalities of gait and mobility: Secondary | ICD-10-CM | POA: Diagnosis not present

## 2017-01-06 DIAGNOSIS — R42 Dizziness and giddiness: Secondary | ICD-10-CM | POA: Diagnosis not present
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
## 2017-01-08 ENCOUNTER — Other Ambulatory Visit: Payer: Self-pay | Admitting: Family Medicine

## 2017-01-14 ENCOUNTER — Ambulatory Visit: Payer: BLUE CROSS/BLUE SHIELD | Admitting: Sports Medicine

## 2017-01-29 ENCOUNTER — Other Ambulatory Visit: Payer: Self-pay | Admitting: Family Medicine

## 2017-04-15 ENCOUNTER — Encounter: Payer: Self-pay | Admitting: Family Medicine

## 2017-04-15 ENCOUNTER — Ambulatory Visit (INDEPENDENT_AMBULATORY_CARE_PROVIDER_SITE_OTHER): Payer: BLUE CROSS/BLUE SHIELD | Admitting: Family Medicine

## 2017-04-15 VITALS — BP 117/69 | HR 77 | Ht 72.0 in | Wt 196.0 lb

## 2017-04-15 DIAGNOSIS — R102 Pelvic and perineal pain: Secondary | ICD-10-CM

## 2017-04-15 DIAGNOSIS — R350 Frequency of micturition: Secondary | ICD-10-CM

## 2017-04-15 DIAGNOSIS — J358 Other chronic diseases of tonsils and adenoids: Secondary | ICD-10-CM | POA: Diagnosis not present

## 2017-04-15 LAB — POCT URINALYSIS DIPSTICK
Bilirubin, UA: NEGATIVE
Glucose, UA: NEGATIVE
KETONES UA: NEGATIVE
Leukocytes, UA: NEGATIVE
Nitrite, UA: NEGATIVE
PROTEIN UA: NEGATIVE
RBC UA: NEGATIVE
SPEC GRAV UA: 1.02 (ref 1.010–1.025)
UROBILINOGEN UA: 0.2 U/dL
pH, UA: 6 (ref 5.0–8.0)
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
## 2017-04-16 ENCOUNTER — Other Ambulatory Visit: Payer: Self-pay | Admitting: Family Medicine

## 2017-04-16 LAB — PSA: PSA: 1 ng/mL (ref <=4.0)

## 2017-04-16 LAB — URINE CULTURE
MICRO NUMBER:: 81342591
RESULT: NO GROWTH
SPECIMEN QUALITY:: ADEQUATE

## 2017-04-16 MED ORDER — CIPROFLOXACIN HCL 500 MG PO TABS: 500.0000 mg | ORAL_TABLET | Freq: Two times a day (BID) | ORAL | 0 refills | Status: AC

## 2017-04-28 ENCOUNTER — Other Ambulatory Visit: Payer: Self-pay | Admitting: *Deleted

## 2017-04-28 DIAGNOSIS — F410 Panic disorder [episodic paroxysmal anxiety] without agoraphobia: Secondary | ICD-10-CM

## 2017-04-28 MED ORDER — FLUOXETINE HCL 20 MG PO CAPS: 20.0000 mg | ORAL_CAPSULE | Freq: Every day | ORAL | 1 refills | Status: DC

## 2017-05-25 ENCOUNTER — Encounter: Payer: Self-pay | Admitting: Family Medicine

## 2017-05-25 ENCOUNTER — Ambulatory Visit (INDEPENDENT_AMBULATORY_CARE_PROVIDER_SITE_OTHER): Payer: BLUE CROSS/BLUE SHIELD | Admitting: Family Medicine

## 2017-05-25 VITALS — BP 123/77 | HR 79 | Ht 72.0 in | Wt 199.0 lb

## 2017-05-25 DIAGNOSIS — R5383 Other fatigue: Secondary | ICD-10-CM | POA: Diagnosis not present

## 2017-05-25 DIAGNOSIS — M7989 Other specified soft tissue disorders: Secondary | ICD-10-CM | POA: Diagnosis not present

## 2017-05-25 DIAGNOSIS — R202 Paresthesia of skin: Secondary | ICD-10-CM

## 2017-05-25 LAB — COMPLETE METABOLIC PANEL WITH GFR
AG Ratio: 1.8 (calc) (ref 1.0–2.5)
ALBUMIN MSPROF: 4.4 g/dL (ref 3.6–5.1)
ALT: 25 U/L (ref 9–46)
AST: 20 U/L (ref 10–35)
Alkaline phosphatase (APISO): 59 U/L (ref 40–115)
BILIRUBIN TOTAL: 0.7 mg/dL (ref 0.2–1.2)
BUN: 15 mg/dL (ref 7–25)
CALCIUM: 9.1 mg/dL (ref 8.6–10.3)
CHLORIDE: 102 mmol/L (ref 98–110)
CO2: 26 mmol/L (ref 20–32)
CREATININE: 0.98 mg/dL (ref 0.70–1.33)
GFR, EST AFRICAN AMERICAN: 103 mL/min/{1.73_m2} (ref >=60)
GFR, EST NON AFRICAN AMERICAN: 89 mL/min/{1.73_m2} (ref >=60)
GLUCOSE: 102 mg/dL — AB (ref 65–99)
Globulin: 2.5 g/dL (calc) (ref 1.9–3.7)
Potassium: 4.3 mmol/L (ref 3.5–5.3)
Sodium: 138 mmol/L (ref 135–146)
TOTAL PROTEIN: 6.9 g/dL (ref 6.1–8.1)

## 2017-05-25 LAB — CBC
HEMATOCRIT: 44.6 % (ref 38.5–50.0)
Hemoglobin: 15.4 g/dL (ref 13.2–17.1)
MCH: 31.1 pg (ref 27.0–33.0)
MCHC: 34.5 g/dL (ref 32.0–36.0)
MCV: 90.1 fL (ref 80.0–100.0)
MPV: 9.1 fL (ref 7.5–12.5)
PLATELETS: 314 10*3/uL (ref 140–400)
RBC: 4.95 10*6/uL (ref 4.20–5.80)
RDW: 11.7 % (ref 11.0–15.0)
WBC: 5.7 10*3/uL (ref 3.8–10.8)

## 2017-05-25 LAB — LIPID PANEL
CHOL/HDL RATIO: 3.3 (calc) (ref <5.0)
Cholesterol: 224 mg/dL — ABNORMAL HIGH (ref <200)
HDL: 68 mg/dL (ref >40)
LDL CHOLESTEROL (CALC): 134 mg/dL — AB
NON-HDL CHOLESTEROL (CALC): 156 mg/dL — AB (ref <130)
TRIGLYCERIDES: 112 mg/dL (ref <150)

## 2017-05-25 LAB — TSH: TSH: 2.38 m[IU]/L (ref 0.40–4.50)
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
## 2017-05-25 NOTE — Patient Instructions (Addendum)
DASH Eating Plan DASH stands for "Dietary Approaches to Stop Hypertension." The DASH eating plan is a healthy eating plan that has been shown to reduce high blood pressure (hypertension). It may also reduce your risk for type 2 diabetes, heart disease, and stroke. The DASH eating plan may also help with weight loss. What are tips for following this plan? General guidelines  Avoid eating more than 2,300 mg (milligrams) of salt (sodium) a day. If you have hypertension, you may need to reduce your sodium intake to 1,500 mg a day.  Limit alcohol intake to no more than 1 drink a day for nonpregnant women and 2 drinks a day for men. One drink equals 12 oz of beer, 5 oz of wine, or 1 oz of hard liquor.  Work with your health care provider to maintain a healthy body weight or to lose weight. Ask what an ideal weight is for you.  Get at least 30 minutes of exercise that causes your heart to beat faster (aerobic exercise) most days of the week. Activities may include walking, swimming, or biking.  Work with your health care provider or diet and nutrition specialist (dietitian) to adjust your eating plan to your individual calorie needs. Reading food labels  Check food labels for the amount of sodium per serving. Choose foods with less than 5 percent of the Daily Value of sodium. Generally, foods with less than 300 mg of sodium per serving fit into this eating plan.  To find whole grains, look for the word "whole" as the first word in the ingredient list. Shopping  Buy products labeled as "low-sodium" or "no salt added."  Buy fresh foods. Avoid canned foods and premade or frozen meals. Cooking  Avoid adding salt when cooking. Use salt-free seasonings or herbs instead of table salt or sea salt. Check with your health care provider or pharmacist before using salt substitutes.  Do not fry foods. Cook foods using healthy methods such as baking, boiling, grilling, and broiling instead.  Cook with  heart-healthy oils, such as olive, canola, soybean, or sunflower oil. Meal planning   Eat a balanced diet that includes: ? 5 or more servings of fruits and vegetables each day. At each meal, try to fill half of your plate with fruits and vegetables. ? Up to 6-8 servings of whole grains each day. ? Less than 6 oz of lean meat, poultry, or fish each day. A 3-oz serving of meat is about the same size as a deck of cards. One egg equals 1 oz. ? 2 servings of low-fat dairy each day. ? A serving of nuts, seeds, or beans 5 times each week. ? Heart-healthy fats. Healthy fats called Omega-3 fatty acids are found in foods such as flaxseeds and coldwater fish, like sardines, salmon, and mackerel.  Limit how much you eat of the following: ? Canned or prepackaged foods. ? Food that is high in trans fat, such as fried foods. ? Food that is high in saturated fat, such as fatty meat. ? Sweets, desserts, sugary drinks, and other foods with added sugar. ? Full-fat dairy products.  Do not salt foods before eating.  Try to eat at least 2 vegetarian meals each week.  Eat more home-cooked food and less restaurant, buffet, and fast food.  When eating at a restaurant, ask that your food be prepared with less salt or no salt, if possible. What foods are recommended? The items listed may not be a complete list. Talk with your dietitian about what   dietary choices are best for you. Grains Whole-grain or whole-wheat bread. Whole-grain or whole-wheat pasta. Brown rice. Oatmeal. Quinoa. Bulgur. Whole-grain and low-sodium cereals. Pita bread. Low-fat, low-sodium crackers. Whole-wheat flour tortillas. Vegetables Fresh or frozen vegetables (raw, steamed, roasted, or grilled). Low-sodium or reduced-sodium tomato and vegetable juice. Low-sodium or reduced-sodium tomato sauce and tomato paste. Low-sodium or reduced-sodium canned vegetables. Fruits All fresh, dried, or frozen fruit. Canned fruit in natural juice (without  added sugar). Meat and other protein foods Skinless chicken or turkey. Ground chicken or turkey. Pork with fat trimmed off. Fish and seafood. Egg whites. Dried beans, peas, or lentils. Unsalted nuts, nut butters, and seeds. Unsalted canned beans. Lean cuts of beef with fat trimmed off. Low-sodium, lean deli meat. Dairy Low-fat (1%) or fat-free (skim) milk. Fat-free, low-fat, or reduced-fat cheeses. Nonfat, low-sodium ricotta or cottage cheese. Low-fat or nonfat yogurt. Low-fat, low-sodium cheese. Fats and oils Soft margarine without trans fats. Vegetable oil. Low-fat, reduced-fat, or light mayonnaise and salad dressings (reduced-sodium). Canola, safflower, olive, soybean, and sunflower oils. Avocado. Seasoning and other foods Herbs. Spices. Seasoning mixes without salt. Unsalted popcorn and pretzels. Fat-free sweets. What foods are not recommended? The items listed may not be a complete list. Talk with your dietitian about what dietary choices are best for you. Grains Baked goods made with fat, such as croissants, muffins, or some breads. Dry pasta or rice meal packs. Vegetables Creamed or fried vegetables. Vegetables in a cheese sauce. Regular canned vegetables (not low-sodium or reduced-sodium). Regular canned tomato sauce and paste (not low-sodium or reduced-sodium). Regular tomato and vegetable juice (not low-sodium or reduced-sodium). Pickles. Olives. Fruits Canned fruit in a light or heavy syrup. Fried fruit. Fruit in cream or butter sauce. Meat and other protein foods Fatty cuts of meat. Ribs. Fried meat. Bacon. Sausage. Bologna and other processed lunch meats. Salami. Fatback. Hotdogs. Bratwurst. Salted nuts and seeds. Canned beans with added salt. Canned or smoked fish. Whole eggs or egg yolks. Chicken or turkey with skin. Dairy Whole or 2% milk, cream, and half-and-half. Whole or full-fat cream cheese. Whole-fat or sweetened yogurt. Full-fat cheese. Nondairy creamers. Whipped toppings.  Processed cheese and cheese spreads. Fats and oils Butter. Stick margarine. Lard. Shortening. Ghee. Bacon fat. Tropical oils, such as coconut, palm kernel, or palm oil. Seasoning and other foods Salted popcorn and pretzels. Onion salt, garlic salt, seasoned salt, table salt, and sea salt. Worcestershire sauce. Tartar sauce. Barbecue sauce. Teriyaki sauce. Soy sauce, including reduced-sodium. Steak sauce. Canned and packaged gravies. Fish sauce. Oyster sauce. Cocktail sauce. Horseradish that you find on the shelf. Ketchup. Mustard. Meat flavorings and tenderizers. Bouillon cubes. Hot sauce and Tabasco sauce. Premade or packaged marinades. Premade or packaged taco seasonings. Relishes. Regular salad dressings. Where to find more information:  National Heart, Lung, and Blood Institute: www.nhlbi.nih.gov  American Heart Association: www.heart.org Summary  The DASH eating plan is a healthy eating plan that has been shown to reduce high blood pressure (hypertension). It may also reduce your risk for type 2 diabetes, heart disease, and stroke.  With the DASH eating plan, you should limit salt (sodium) intake to 2,300 mg a day. If you have hypertension, you may need to reduce your sodium intake to 1,500 mg a day.  When on the DASH eating plan, aim to eat more fresh fruits and vegetables, whole grains, lean proteins, low-fat dairy, and heart-healthy fats.  Work with your health care provider or diet and nutrition specialist (dietitian) to adjust your eating plan to your individual   calorie needs. This information is not intended to replace advice given to you by your health care provider. Make sure you discuss any questions you have with your health care provider. Document Released: 04/23/2011 Document Revised: 04/27/2016 Document Reviewed: 04/27/2016 Elsevier Interactive Patient Education  2018 [employer]  

## 2017-05-26 LAB — TESTOSTERONE: Testosterone: 494 ng/dL (ref 250–827)

## 2017-07-23 ENCOUNTER — Ambulatory Visit: Payer: BLUE CROSS/BLUE SHIELD | Admitting: Family Medicine

## 2017-07-23 ENCOUNTER — Ambulatory Visit (INDEPENDENT_AMBULATORY_CARE_PROVIDER_SITE_OTHER): Payer: BLUE CROSS/BLUE SHIELD | Admitting: Sports Medicine

## 2017-07-23 ENCOUNTER — Ambulatory Visit (INDEPENDENT_AMBULATORY_CARE_PROVIDER_SITE_OTHER): Payer: BLUE CROSS/BLUE SHIELD | Admitting: Physician Assistant

## 2017-07-23 ENCOUNTER — Encounter: Payer: Self-pay | Admitting: Sports Medicine

## 2017-07-23 ENCOUNTER — Encounter: Payer: Self-pay | Admitting: Physician Assistant

## 2017-07-23 VITALS — BP 140/73 | HR 87 | Temp 97.6°F | Ht 72.0 in | Wt 200.0 lb

## 2017-07-23 DIAGNOSIS — J069 Acute upper respiratory infection, unspecified: Secondary | ICD-10-CM

## 2017-07-23 DIAGNOSIS — M7711 Lateral epicondylitis, right elbow: Secondary | ICD-10-CM | POA: Diagnosis not present

## 2017-07-23 DIAGNOSIS — J019 Acute sinusitis, unspecified: Secondary | ICD-10-CM | POA: Diagnosis not present

## 2017-07-23 MED ORDER — FLUTICASONE PROPIONATE 50 MCG/ACT NA SUSP: 1.0000 | Freq: Two times a day (BID) | NASAL | 3 refills | Status: DC

## 2017-07-23 MED ORDER — AMOXICILLIN-POT CLAVULANATE 875-125 MG PO TABS: 1.0000 | ORAL_TABLET | Freq: Two times a day (BID) | ORAL | 0 refills | Status: DC

## 2017-07-23 NOTE — Patient Instructions (Signed)
Upper Respiratory Infection, Adult Most upper respiratory infections (URIs) are caused by a virus. A URI affects the nose, throat, and upper air passages. The most common type of URI is often called "the common cold." Follow these instructions at home:  Take medicines only as told by your doctor.  Gargle warm saltwater or take cough drops to comfort your throat as told by your doctor.  Use a warm mist humidifier or inhale steam from a shower to increase air moisture. This may make it easier to breathe.  Drink enough fluid to keep your pee (urine) clear or pale yellow.  Eat soups and other clear broths.  Have a healthy diet.  Rest as needed.  Go back to work when your fever is gone or your doctor says it is okay. ? You may need to stay home longer to avoid giving your URI to others. ? You can also wear a face mask and wash your hands often to prevent spread of the virus.  Use your inhaler more if you have asthma.  Do not use any tobacco products, including cigarettes, chewing tobacco, or electronic cigarettes. If you need help quitting, ask your doctor. Contact a doctor if:  You are getting worse, not better.  Your symptoms are not helped by medicine.  You have chills.  You are getting more short of breath.  You have brown or red mucus.  You have yellow or brown discharge from your nose.  You have pain in your face, especially when you bend forward.  You have a fever.  You have puffy (swollen) neck glands.  You have pain while swallowing.  You have white areas in the back of your throat. Get help right away if:  You have very bad or constant: ? Headache. ? Ear pain. ? Pain in your forehead, behind your eyes, and over your cheekbones (sinus pain). ? Chest pain.  You have long-lasting (chronic) lung disease and any of the following: ? Wheezing. ? Long-lasting cough. ? Coughing up blood. ? A change in your usual mucus.  You have a stiff neck.  You have  changes in your: ? Vision. ? Hearing. ? Thinking. ? Mood. This information is not intended to replace advice given to you by your health care provider. Make sure you discuss any questions you have with your health care provider. Document Released: 10/21/2007 Document Revised: 01/05/2016 Document Reviewed: 08/09/2013 Elsevier Interactive Patient Education  2018 [employer]  
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
## 2017-07-23 NOTE — Assessment & Plan Note (Signed)
Previous injection was about a year ago, repeat right common extensor tendon origin injection. Continue with rehab exercises, return as needed.
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
## 2017-07-25 ENCOUNTER — Encounter: Payer: Self-pay | Admitting: Physician Assistant

## 2017-08-01 ENCOUNTER — Other Ambulatory Visit: Payer: Self-pay | Admitting: Family Medicine

## 2017-10-14 ENCOUNTER — Other Ambulatory Visit: Payer: Self-pay | Admitting: Physician Assistant

## 2017-10-14 DIAGNOSIS — J069 Acute upper respiratory infection, unspecified: Secondary | ICD-10-CM

## 2017-11-05 ENCOUNTER — Other Ambulatory Visit: Payer: Self-pay | Admitting: Family Medicine

## 2017-11-05 DIAGNOSIS — F410 Panic disorder [episodic paroxysmal anxiety] without agoraphobia: Secondary | ICD-10-CM

## 2017-12-07 ENCOUNTER — Other Ambulatory Visit: Payer: Self-pay | Admitting: Family Medicine

## 2017-12-07 DIAGNOSIS — F410 Panic disorder [episodic paroxysmal anxiety] without agoraphobia: Secondary | ICD-10-CM

## 2017-12-20 ENCOUNTER — Ambulatory Visit (INDEPENDENT_AMBULATORY_CARE_PROVIDER_SITE_OTHER): Payer: BLUE CROSS/BLUE SHIELD | Admitting: Sports Medicine

## 2017-12-20 DIAGNOSIS — M7711 Lateral epicondylitis, right elbow: Secondary | ICD-10-CM | POA: Diagnosis not present
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
## 2017-12-20 NOTE — Assessment & Plan Note (Signed)
16-month response to previous tennis elbow injections, repeated today, adding counterforce brace. Additional rehab exercises given, return in 1 month.

## 2018-01-23 ENCOUNTER — Other Ambulatory Visit: Payer: Self-pay | Admitting: Family Medicine

## 2018-01-23 DIAGNOSIS — F410 Panic disorder [episodic paroxysmal anxiety] without agoraphobia: Secondary | ICD-10-CM

## 2018-02-08 ENCOUNTER — Other Ambulatory Visit: Payer: Self-pay | Admitting: Family Medicine

## 2018-03-01 ENCOUNTER — Ambulatory Visit (INDEPENDENT_AMBULATORY_CARE_PROVIDER_SITE_OTHER): Payer: BLUE CROSS/BLUE SHIELD

## 2018-03-01 ENCOUNTER — Ambulatory Visit (INDEPENDENT_AMBULATORY_CARE_PROVIDER_SITE_OTHER): Payer: BLUE CROSS/BLUE SHIELD | Admitting: Sports Medicine

## 2018-03-01 ENCOUNTER — Encounter: Payer: Self-pay | Admitting: Sports Medicine

## 2018-03-01 DIAGNOSIS — M5030 Other cervical disc degeneration, unspecified cervical region: Secondary | ICD-10-CM

## 2018-03-01 DIAGNOSIS — M50322 Other cervical disc degeneration at C5-C6 level: Secondary | ICD-10-CM

## 2018-03-01 DIAGNOSIS — M7711 Lateral epicondylitis, right elbow: Secondary | ICD-10-CM

## 2018-03-01 DIAGNOSIS — M542 Cervicalgia: Secondary | ICD-10-CM | POA: Diagnosis not present

## 2018-03-01 MED ORDER — PREDNISONE 50 MG PO TABS: ORAL_TABLET | ORAL | 0 refills | Status: DC

## 2018-03-01 MED ORDER — CYCLOBENZAPRINE HCL 10 MG PO TABS: ORAL_TABLET | ORAL | 0 refills | Status: DC

## 2018-03-01 NOTE — Assessment & Plan Note (Signed)
Resolved after ultrasound-guided injection and counterforce bracing, rehabilitation exercises done back in August.

## 2018-03-01 NOTE — Assessment & Plan Note (Signed)
Right C7 distribution radiculitis. No progressive weakness. We will start conservatively, x-rays, 5 days of prednisone, Flexeril at bedtime. Home rehab exercises given. He did have a good experience with chiropractic manipulation in the past and would like to try this again. Referral to Dr. Earl Lagos. I would like to see him back in a month, if persistent symptoms we will consider MRI for interventional planning.
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
## 2018-03-03 ENCOUNTER — Other Ambulatory Visit: Payer: Self-pay | Admitting: Chiropractor

## 2018-03-03 DIAGNOSIS — M5030 Other cervical disc degeneration, unspecified cervical region: Secondary | ICD-10-CM | POA: Diagnosis not present

## 2018-03-03 DIAGNOSIS — M9901 Segmental and somatic dysfunction of cervical region: Secondary | ICD-10-CM | POA: Diagnosis not present

## 2018-03-03 DIAGNOSIS — M9902 Segmental and somatic dysfunction of thoracic region: Secondary | ICD-10-CM | POA: Diagnosis not present

## 2018-03-03 DIAGNOSIS — M5412 Radiculopathy, cervical region: Secondary | ICD-10-CM | POA: Diagnosis not present

## 2018-03-03 DIAGNOSIS — M5010 Cervical disc disorder with radiculopathy, unspecified cervical region: Secondary | ICD-10-CM

## 2018-03-07 DIAGNOSIS — M9901 Segmental and somatic dysfunction of cervical region: Secondary | ICD-10-CM | POA: Diagnosis not present

## 2018-03-07 DIAGNOSIS — M5030 Other cervical disc degeneration, unspecified cervical region: Secondary | ICD-10-CM | POA: Diagnosis not present

## 2018-03-07 DIAGNOSIS — M5412 Radiculopathy, cervical region: Secondary | ICD-10-CM | POA: Diagnosis not present

## 2018-03-07 DIAGNOSIS — M9902 Segmental and somatic dysfunction of thoracic region: Secondary | ICD-10-CM | POA: Diagnosis not present

## 2018-03-12 ENCOUNTER — Other Ambulatory Visit: Payer: Self-pay | Admitting: Family Medicine

## 2018-03-12 DIAGNOSIS — J069 Acute upper respiratory infection, unspecified: Secondary | ICD-10-CM

## 2018-03-14 ENCOUNTER — Ambulatory Visit (INDEPENDENT_AMBULATORY_CARE_PROVIDER_SITE_OTHER): Payer: BLUE CROSS/BLUE SHIELD

## 2018-03-14 DIAGNOSIS — M50122 Cervical disc disorder at C5-C6 level with radiculopathy: Secondary | ICD-10-CM

## 2018-03-14 DIAGNOSIS — M5010 Cervical disc disorder with radiculopathy, unspecified cervical region: Secondary | ICD-10-CM

## 2018-03-14 DIAGNOSIS — M47812 Spondylosis without myelopathy or radiculopathy, cervical region: Secondary | ICD-10-CM | POA: Diagnosis not present

## 2018-03-15 ENCOUNTER — Ambulatory Visit (INDEPENDENT_AMBULATORY_CARE_PROVIDER_SITE_OTHER): Payer: BLUE CROSS/BLUE SHIELD | Admitting: Sports Medicine

## 2018-03-15 ENCOUNTER — Encounter: Payer: Self-pay | Admitting: Sports Medicine

## 2018-03-15 ENCOUNTER — Other Ambulatory Visit: Payer: Self-pay | Admitting: Sports Medicine

## 2018-03-15 DIAGNOSIS — M5030 Other cervical disc degeneration, unspecified cervical region: Secondary | ICD-10-CM
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
## 2018-03-15 NOTE — Assessment & Plan Note (Signed)
Sensory right C7 distribution. Now with new onset weakness to wrist dorsiflexion. He does have a large right-sided C5-C6 disc protrusion. Considering weakness we are going to get him in urgently to Dr. Lynann Bologna. I am also going to go ahead and order a cervical epidural although I do suspect Dumonski will be taking him to the operating room sooner rather than later.

## 2018-03-18 ENCOUNTER — Encounter: Payer: Self-pay | Admitting: Sports Medicine

## 2018-03-21 DIAGNOSIS — M5412 Radiculopathy, cervical region: Secondary | ICD-10-CM | POA: Diagnosis not present

## 2018-03-25 ENCOUNTER — Encounter: Payer: Self-pay | Admitting: Sports Medicine

## 2018-03-25 ENCOUNTER — Ambulatory Visit
Admission: RE | Admit: 2018-03-25 | Discharge: 2018-03-25 | Disposition: A | Discharge status: Home / Self Care | Payer: BLUE CROSS/BLUE SHIELD | Source: Ambulatory Visit | Attending: Sports Medicine | Admitting: Sports Medicine

## 2018-03-25 MED ORDER — IOPAMIDOL (ISOVUE-M 300) INJECTION 61%: 1.0000 mL | Freq: Once | INTRAMUSCULAR | Status: DC | PRN

## 2018-03-25 MED ORDER — TRIAMCINOLONE ACETONIDE 40 MG/ML IJ SUSP (RADIOLOGY): 60.0000 mg | Freq: Once | INTRAMUSCULAR | Status: DC

## 2018-03-31 ENCOUNTER — Ambulatory Visit (INDEPENDENT_AMBULATORY_CARE_PROVIDER_SITE_OTHER): Payer: BLUE CROSS/BLUE SHIELD | Admitting: Rehabilitative and Restorative Service Providers"

## 2018-03-31 DIAGNOSIS — M6281 Muscle weakness (generalized): Secondary | ICD-10-CM | POA: Diagnosis not present

## 2018-03-31 DIAGNOSIS — R29898 Other symptoms and signs involving the musculoskeletal system: Secondary | ICD-10-CM | POA: Diagnosis not present

## 2018-03-31 DIAGNOSIS — M542 Cervicalgia: Secondary | ICD-10-CM
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
## 2018-03-31 NOTE — Patient Instructions (Addendum)
Axial Extension (Chin Tuck)    Pull chin in and lengthen back of neck. Hold __5__ seconds while counting out loud. Repeat __10__ times. Do __several__ sessions per day.  Shoulder Blade Squeeze    Rotate shoulders back, then squeeze shoulder blades together. Repeat ____ times. Do ____ sessions per day.  Upper Back Strength: Lower Trapezius / Rotator Cuff " L's "     Arms in waitress pose, palms up. Press hands back and slide shoulder blades down. Hold for __5__ seconds. Repeat _10___ times. 1-2 times per day.    Scapular Retraction: Elbow Flexion (Standing)  "W's"     With elbows bent to 90, pinch shoulder blades together and rotate arms out, keeping elbows bent. Repeat __10__ times per set. Do __1-2__ sets per session. Do _several ___ sessions per day.  Scapula Adduction With Pectoralis Stretch: Low - Standing   Shoulders at 45 hands even with shoulders, keeping weight through legs, shift weight forward until you feel pull or stretch through the front of your chest. Hold _30__ seconds. Do _3__ times, _2-4__ times per day.   Scapula Adduction With Pectoralis Stretch: Mid-Range - Standing   Shoulders at 90 elbows even with shoulders, keeping weight through legs, shift weight forward until you feel pull or strength through the front of your chest. Hold __30_ seconds. Do _3__ times, __2-4_ times per day.   Scapula Adduction With Pectoralis Stretch: High - Standing   Shoulders at 120 hands up high on the doorway, keeping weight on feet, shift weight forward until you feel pull or stretch through the front of your chest. Hold _30__ seconds. Do _3__ times, _2-3__ times per day.       Wrist Extension: Resisted    With right palm down, __2-3__ pound weight in hand, bend wrist up. Return slowly. Repeat __10__ times per set. Do __1-3__ sets per session. Do __2-3__ sessions per day.    Forearm Supination / Pronation: Resisted    With __2-3__ pound object in  right hand, slowly turn palm up, then down. Repeat __10__ times per set. Do __1-3__ sets per session. Do __2-3__ sessions per day.    Towel Roll Squeeze    With right forearm resting on surface, gently squeeze towel. Repeat __20__ times per set. Do __1-2__ sets per session. Do _2-3___ sessions per day.  Grip strength - thumb work ~ 5 min 2-3 times/day   [facility] [place], [place] [address]  [phone] (office) [phone] (fax)

## 2018-04-05 ENCOUNTER — Encounter: Payer: Self-pay | Admitting: Rehabilitative and Restorative Service Providers"

## 2018-04-05 ENCOUNTER — Encounter: Payer: Self-pay | Admitting: Sports Medicine

## 2018-04-05 ENCOUNTER — Ambulatory Visit (INDEPENDENT_AMBULATORY_CARE_PROVIDER_SITE_OTHER): Payer: BLUE CROSS/BLUE SHIELD | Admitting: Rehabilitative and Restorative Service Providers"

## 2018-04-05 ENCOUNTER — Ambulatory Visit (INDEPENDENT_AMBULATORY_CARE_PROVIDER_SITE_OTHER): Payer: BLUE CROSS/BLUE SHIELD | Admitting: Sports Medicine

## 2018-04-05 DIAGNOSIS — R29898 Other symptoms and signs involving the musculoskeletal system: Secondary | ICD-10-CM

## 2018-04-05 DIAGNOSIS — M542 Cervicalgia: Secondary | ICD-10-CM | POA: Diagnosis not present

## 2018-04-05 DIAGNOSIS — M6281 Muscle weakness (generalized): Secondary | ICD-10-CM | POA: Diagnosis not present

## 2018-04-05 DIAGNOSIS — M5030 Other cervical disc degeneration, unspecified cervical region: Secondary | ICD-10-CM | POA: Diagnosis not present
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
## 2018-04-05 NOTE — Patient Instructions (Addendum)
Resisted External Rotation: in Neutral - Bilateral   PALMS UP Sit or stand, tubing in both hands, elbows at sides, bent to 90, forearms forward. Pinch shoulder blades together and rotate forearms out. Keep elbows at sides. Repeat __10__ times per set. Do _2-3___ sets per session. Do _2-3___ sessions per day.   Low Row: Standing   Face anchor, feet shoulder width apart. Palms up, pull arms back, squeezing shoulder blades down and back. Repeat 10__ times per set. Do 2-3__ sets per session. Do 2-3__ sessions per week. Anchor Height: Waist   Strengthening: Resisted Extension   Hold tubing in right hand, arm forward. Pull arm back, elbow straight. Repeat _10___ times per set. Do 2-3____ sets per session. Do 2-3____ sessions per day.  Ball on wall(or towel on wall) keep shoulders down and back; shoulder blades back/no winging  Start at shoulder height  Up/down Side to side Circles CW/CCW    Start in corerct alignment Sit with ball at right side - press into the ball hold 5 sec x 10 reps  Roll ball fwd/back; side to side; circles CW/CCW  Tap ball with fingers   Neurovascular: Median Nerve Glide With Cervical Bias - Supine  NO PAIN!!!     Lie with neck supported, right arm out to side, elbow straight, thumb down, fingers and wrist bent back. Slowly move opposite side ear toward shoulder as far as possible without pain. Repeat __2__ times per set. 60 sec hold  2 sessions per day    Flexors, Supine on a pillow(not a thick pillow - kinda thin)    Lie on back, head on small, rolled towel. Tip chin down. Tighten muscles in back of throat. Hold _10__ seconds. Repeat _3-5__ times per session. Do _2-3__ sessions per day.  .  Side Bend, keep chin tucked     Bring hand over top of head. Gently pull head to one side. Hold __5-10_ seconds. Repeat __2-3_ times per session. Do _2-3__ sessions per day.    AROM, Rotation    Head supported and comfortable, centered position.  Turn head slowly to look over one shoulder. Hold __5-10_ seconds. Repeat to other side. Repeat _2-3__ times per session. Do _2-3__ sessions per day.

## 2018-04-05 NOTE — Assessment & Plan Note (Signed)
Really does not have any sensory symptoms now, more weakness to wrist extension on the right but overall improving. He did see Pricilla Holm, PA-C, recommended continued conservative measures, he has done a single session of physical therapy and plans to continue. I would like them to objectively grade his right wrist extension strength, if he continues to improve we can avoid operative intervention, if he has plateauing or worsening of strength of the right wrist extension he will need to follow back up with [facility] and Dr. Lynann Bologna.

## 2018-04-05 NOTE — Patient Instructions (Signed)
Objective: At least 4 weeks of formal physical therapy with objective grading of right wrist extension strength, grip strength. If plateauing of improvement in weakness then need to see Dr. Lynann Bologna. If worsening of weakness then need to see Dr. Lynann Bologna. If continued improvements in strength to the point of return to normal then can follow-up with me as needed. For now avoid lifting more than 20 to 30 pounds, avoid push-ups and working out for now.

## 2018-04-11 ENCOUNTER — Encounter: Payer: Self-pay | Admitting: Rehabilitative and Restorative Service Providers"

## 2018-04-11 ENCOUNTER — Ambulatory Visit (INDEPENDENT_AMBULATORY_CARE_PROVIDER_SITE_OTHER): Payer: BLUE CROSS/BLUE SHIELD | Admitting: Rehabilitative and Restorative Service Providers"

## 2018-04-11 DIAGNOSIS — R2689 Other abnormalities of gait and mobility: Secondary | ICD-10-CM

## 2018-04-11 DIAGNOSIS — R29898 Other symptoms and signs involving the musculoskeletal system: Secondary | ICD-10-CM

## 2018-04-11 DIAGNOSIS — R42 Dizziness and giddiness: Secondary | ICD-10-CM | POA: Diagnosis not present

## 2018-04-11 DIAGNOSIS — M542 Cervicalgia: Secondary | ICD-10-CM | POA: Diagnosis not present

## 2018-04-11 DIAGNOSIS — M6281 Muscle weakness (generalized): Secondary | ICD-10-CM | POA: Diagnosis not present
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
## 2018-04-12 DIAGNOSIS — M5412 Radiculopathy, cervical region: Secondary | ICD-10-CM | POA: Diagnosis not present

## 2018-05-16 DIAGNOSIS — M542 Cervicalgia: Secondary | ICD-10-CM | POA: Diagnosis not present

## 2018-08-20 ENCOUNTER — Other Ambulatory Visit: Payer: Self-pay | Admitting: Family Medicine

## 2018-08-20 DIAGNOSIS — F410 Panic disorder [episodic paroxysmal anxiety] without agoraphobia: Secondary | ICD-10-CM

## 2019-02-12 DIAGNOSIS — R4182 Altered mental status, unspecified: Secondary | ICD-10-CM | POA: Diagnosis not present

## 2019-02-12 DIAGNOSIS — R110 Nausea: Secondary | ICD-10-CM | POA: Diagnosis not present

## 2019-02-12 DIAGNOSIS — I10 Essential (primary) hypertension: Secondary | ICD-10-CM | POA: Diagnosis not present

## 2019-02-12 DIAGNOSIS — Z79899 Other long term (current) drug therapy: Secondary | ICD-10-CM | POA: Diagnosis not present

## 2019-02-12 DIAGNOSIS — F411 Generalized anxiety disorder: Secondary | ICD-10-CM | POA: Diagnosis not present

## 2019-02-12 DIAGNOSIS — F419 Anxiety disorder, unspecified: Secondary | ICD-10-CM | POA: Diagnosis not present

## 2019-02-12 DIAGNOSIS — R231 Pallor: Secondary | ICD-10-CM | POA: Diagnosis not present

## 2019-02-16 ENCOUNTER — Other Ambulatory Visit: Payer: Self-pay

## 2019-02-16 ENCOUNTER — Ambulatory Visit (INDEPENDENT_AMBULATORY_CARE_PROVIDER_SITE_OTHER): Payer: BLUE CROSS/BLUE SHIELD | Admitting: Family Medicine

## 2019-02-16 ENCOUNTER — Encounter: Payer: Self-pay | Admitting: Family Medicine

## 2019-02-16 VITALS — BP 131/88 | HR 85 | Ht 72.0 in | Wt 204.0 lb

## 2019-02-16 DIAGNOSIS — H832X3 Labyrinthine dysfunction, bilateral: Secondary | ICD-10-CM

## 2019-02-16 DIAGNOSIS — R0602 Shortness of breath: Secondary | ICD-10-CM | POA: Diagnosis not present

## 2019-02-16 DIAGNOSIS — Z0000 Encounter for general adult medical examination without abnormal findings: Secondary | ICD-10-CM | POA: Diagnosis not present

## 2019-02-16 DIAGNOSIS — R7309 Other abnormal glucose: Secondary | ICD-10-CM | POA: Diagnosis not present

## 2019-02-16 DIAGNOSIS — R5383 Other fatigue: Secondary | ICD-10-CM

## 2019-02-16 MED ORDER — CLONAZEPAM 0.5 MG PO TABS: 0.5000 mg | ORAL_TABLET | Freq: Every day | ORAL | 0 refills | Status: DC | PRN

## 2019-02-16 MED ORDER — FLUOXETINE HCL 10 MG PO CAPS: 10.0000 mg | ORAL_CAPSULE | Freq: Every day | ORAL | 2 refills | Status: DC

## 2019-02-16 NOTE — Assessment & Plan Note (Signed)
Sounds like he is having some mild recurrence of symptoms.  He is been trying to do the exercises he was given.  I also gave him some additional exercises though they are more for BPPV but it may be worth trying to add to his regimen to see if this helps as well.
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
## 2019-02-17 LAB — COMPLETE METABOLIC PANEL WITH GFR
AG Ratio: 1.5 (calc) (ref 1.0–2.5)
ALT: 30 U/L (ref 9–46)
AST: 27 U/L (ref 10–35)
Albumin: 4.3 g/dL (ref 3.6–5.1)
Alkaline phosphatase (APISO): 58 U/L (ref 35–144)
BUN: 14 mg/dL (ref 7–25)
CO2: 29 mmol/L (ref 20–32)
Calcium: 9.6 mg/dL (ref 8.6–10.3)
Chloride: 101 mmol/L (ref 98–110)
Creat: 1.14 mg/dL (ref 0.70–1.33)
GFR, Est African American: 85 mL/min/{1.73_m2} (ref >=60)
GFR, Est Non African American: 73 mL/min/{1.73_m2} (ref >=60)
Globulin: 2.8 g/dL (calc) (ref 1.9–3.7)
Glucose, Bld: 105 mg/dL — ABNORMAL HIGH (ref 65–99)
Potassium: 4.6 mmol/L (ref 3.5–5.3)
Sodium: 139 mmol/L (ref 135–146)
Total Bilirubin: 0.8 mg/dL (ref 0.2–1.2)
Total Protein: 7.1 g/dL (ref 6.1–8.1)

## 2019-02-17 LAB — CBC
HCT: 46.7 % (ref 38.5–50.0)
Hemoglobin: 15.9 g/dL (ref 13.2–17.1)
MCH: 31.7 pg (ref 27.0–33.0)
MCHC: 34 g/dL (ref 32.0–36.0)
MCV: 93.2 fL (ref 80.0–100.0)
MPV: 9.2 fL (ref 7.5–12.5)
Platelets: 323 10*3/uL (ref 140–400)
RBC: 5.01 10*6/uL (ref 4.20–5.80)
RDW: 12 % (ref 11.0–15.0)
WBC: 5.7 10*3/uL (ref 3.8–10.8)

## 2019-02-17 LAB — LIPID PANEL
Cholesterol: 216 mg/dL — ABNORMAL HIGH (ref <200)
HDL: 60 mg/dL (ref >=40)
LDL Cholesterol (Calc): 128 mg/dL (calc) — ABNORMAL HIGH
Non-HDL Cholesterol (Calc): 156 mg/dL (calc) — ABNORMAL HIGH (ref <130)
Total CHOL/HDL Ratio: 3.6 (calc) (ref <5.0)
Triglycerides: 165 mg/dL — ABNORMAL HIGH (ref <150)

## 2019-02-17 LAB — URINALYSIS, ROUTINE W REFLEX MICROSCOPIC
Bilirubin Urine: NEGATIVE
Glucose, UA: NEGATIVE
Hgb urine dipstick: NEGATIVE
Ketones, ur: NEGATIVE
Leukocytes,Ua: NEGATIVE
Nitrite: NEGATIVE
Protein, ur: NEGATIVE
Specific Gravity, Urine: 1.012 (ref 1.001–1.03)
pH: 7.5 (ref 5.0–8.0)

## 2019-02-17 LAB — TSH: TSH: 3.73 mIU/L (ref 0.40–4.50)

## 2019-02-17 LAB — HEMOGLOBIN A1C
Hgb A1c MFr Bld: 5 % of total Hgb (ref <5.7)
Mean Plasma Glucose: 97 (calc)
eAG (mmol/L): 5.4 (calc)

## 2019-02-17 LAB — TESTOSTERONE TOTAL,FREE,BIO, MALES
Albumin: 4.3 g/dL (ref 3.6–5.1)
Sex Hormone Binding: 46 nmol/L (ref 10–50)
Testosterone, Bioavailable: 132.9 ng/dL (ref >=110.0)
Testosterone, Free: 67.5 pg/mL (ref 46.0–224.0)
Testosterone: 636 ng/dL (ref 250–827)

## 2019-02-17 LAB — PSA: PSA: 0.9 ng/mL (ref <=4.0)

## 2019-03-16 ENCOUNTER — Ambulatory Visit (INDEPENDENT_AMBULATORY_CARE_PROVIDER_SITE_OTHER): Payer: BLUE CROSS/BLUE SHIELD | Admitting: Family Medicine

## 2019-03-16 ENCOUNTER — Other Ambulatory Visit: Payer: Self-pay

## 2019-03-16 ENCOUNTER — Encounter: Payer: Self-pay | Admitting: Family Medicine

## 2019-03-16 DIAGNOSIS — R05 Cough: Secondary | ICD-10-CM

## 2019-03-16 DIAGNOSIS — Z20828 Contact with and (suspected) exposure to other viral communicable diseases: Secondary | ICD-10-CM | POA: Diagnosis not present

## 2019-03-16 DIAGNOSIS — M79629 Pain in unspecified upper arm: Secondary | ICD-10-CM | POA: Diagnosis not present

## 2019-03-16 DIAGNOSIS — Z20822 Contact with and (suspected) exposure to covid-19: Secondary | ICD-10-CM

## 2019-03-16 DIAGNOSIS — R059 Cough, unspecified: Secondary | ICD-10-CM

## 2019-03-16 DIAGNOSIS — F410 Panic disorder [episodic paroxysmal anxiety] without agoraphobia: Secondary | ICD-10-CM

## 2019-03-16 NOTE — Assessment & Plan Note (Signed)
We did discuss him restarting fluoxetine.  He did not restart it since we last talked.  He still feels like he has a lot of anxiety particularly around his health and feels like that has ramped up since he has been off of an SSRI.  Encouraged him to consider restarting medication I think it could actually be really helpful.  Call if any problems or concerns.  Otherwise I had like to see him back in about 2 to 3 months.
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
## 2019-03-17 ENCOUNTER — Other Ambulatory Visit: Payer: Self-pay | Admitting: Family Medicine

## 2019-03-18 ENCOUNTER — Other Ambulatory Visit: Payer: Self-pay | Admitting: Family Medicine

## 2019-03-18 DIAGNOSIS — F410 Panic disorder [episodic paroxysmal anxiety] without agoraphobia: Secondary | ICD-10-CM

## 2019-03-19 ENCOUNTER — Encounter: Payer: Self-pay | Admitting: Family Medicine

## 2019-03-20 ENCOUNTER — Other Ambulatory Visit: Payer: Self-pay | Admitting: Family Medicine

## 2019-03-20 MED ORDER — ALBUTEROL SULFATE HFA 108 (90 BASE) MCG/ACT IN AERS: 2.0000 | INHALATION_SPRAY | Freq: Four times a day (QID) | RESPIRATORY_TRACT | 1 refills | Status: DC | PRN

## 2019-03-20 NOTE — Telephone Encounter (Signed)
He has had a fever for the last 2 days of 101. Denies shortness of breath. He has body aches, cough, sore throat and no taste or smell. Patient scheduled for virtual visit.   People with COVID-19 have had a wide range of symptoms reported - ranging from mild symptoms to severe illness. Symptoms may appear 2-14 days after exposure to the virus. People with these symptoms may have COVID-19: . Fever or chills . Cough . Shortness of breath or difficulty breathing . Fatigue . Muscle or body aches . Headache . New loss of taste or smell . Sore throat . Congestion or runny nose . Nausea or vomiting . Diarrhea .  When to Seek Emergency Medical Attention Look for emergency warning signs* for COVID-19. If someone is showing any of these signs, seek emergency medical care immediately . Trouble breathing . Persistent pain or pressure in the chest . New confusion . Inability to wake or stay awake . Bluish lips or face  How to self-isolate  . Use a separate room and bathroom for sick household members (if possible). Wendee Copp your hands often with soap and water for at least 20 seconds, especially after blowing your nose, coughing, or sneezing; going to the bathroom; and before eating or preparing food. . If soap and water are not readily available, use an alcohol-based hand sanitizer with at least 60% alcohol. Always wash hands with soap and water if hands are visibly dirty. . Provide your sick household member with clean disposable facemasks to wear at home, if available, to help prevent spreading COVID-19 to others. . Clean the sick room and bathroom, as needed, to avoid unnecessary contact with the sick person. Marland Kitchen Avoid sharing personal items like utensils, food, and drinks.   If you feel healthy but: . Recently had close contact with a person with COVID-19 Steps to take. Stay Home and Monitor Your Health [facility]) . Stay home until 14 days after your last exposure. . Check your  temperature twice a day and watch for symptoms of COVID-19. . If possible, stay away from people who are at higher-risk for getting very sick from COVID-19.  If you: . Have been diagnosed with COVID-19, or . Are waiting for test results, or . Have cough, fever, or shortness of breath, or other symptoms of COVID-19 Steps to take. Isolate Yourself from Others (Isolation) . Stay home until it is safe to be around others. . If you live with others, stay in a specific "sick room" or area and away from other people or animals, including pets. Use a separate bathroom, if available. . Read important information about caring for yourself or someone else who is sick, including when it's safe to end home isolation.

## 2019-03-21 ENCOUNTER — Ambulatory Visit (INDEPENDENT_AMBULATORY_CARE_PROVIDER_SITE_OTHER): Payer: BLUE CROSS/BLUE SHIELD | Admitting: Family Medicine

## 2019-03-21 ENCOUNTER — Encounter: Payer: Self-pay | Admitting: Family Medicine

## 2019-03-21 VITALS — BP 118/88 | HR 81 | Temp 98.8°F | Ht 72.0 in

## 2019-03-21 DIAGNOSIS — U071 COVID-19: Secondary | ICD-10-CM
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
## 2019-03-24 DIAGNOSIS — I10 Essential (primary) hypertension: Secondary | ICD-10-CM | POA: Diagnosis not present

## 2019-03-24 DIAGNOSIS — U071 COVID-19: Secondary | ICD-10-CM | POA: Insufficient documentation

## 2019-03-24 DIAGNOSIS — J849 Interstitial pulmonary disease, unspecified: Secondary | ICD-10-CM | POA: Diagnosis not present

## 2019-03-24 DIAGNOSIS — J1282 Pneumonia due to coronavirus disease 2019: Secondary | ICD-10-CM

## 2019-03-24 DIAGNOSIS — J1289 Other viral pneumonia: Secondary | ICD-10-CM | POA: Diagnosis not present

## 2019-03-24 DIAGNOSIS — R0602 Shortness of breath: Secondary | ICD-10-CM | POA: Diagnosis not present

## 2019-03-24 DIAGNOSIS — F419 Anxiety disorder, unspecified: Secondary | ICD-10-CM | POA: Diagnosis not present

## 2019-03-24 DIAGNOSIS — R918 Other nonspecific abnormal finding of lung field: Secondary | ICD-10-CM | POA: Diagnosis not present

## 2019-03-24 DIAGNOSIS — Z79899 Other long term (current) drug therapy: Secondary | ICD-10-CM | POA: Diagnosis not present

## 2019-03-24 HISTORY — DX: Pneumonia due to coronavirus disease 2019: J12.82

## 2019-03-27 ENCOUNTER — Telehealth: Payer: Self-pay | Admitting: Family Medicine

## 2019-03-27 MED ORDER — IPRATROPIUM-ALBUTEROL 0.5-2.5 (3) MG/3ML IN SOLN
3.00 | RESPIRATORY_TRACT | Status: DC
Start: ? — End: 2019-03-27

## 2019-03-27 MED ORDER — GUAIFENESIN 100 MG/5ML PO LIQD
200.00 | ORAL | Status: DC
Start: ? — End: 2019-03-27

## 2019-03-27 MED ORDER — FLUOXETINE HCL 10 MG PO CAPS
10.00 | ORAL_CAPSULE | ORAL | Status: DC
Start: 2019-03-27 — End: 2019-03-27

## 2019-03-27 MED ORDER — GENERIC EXTERNAL MEDICATION
Status: DC
Start: ? — End: 2019-03-27

## 2019-03-27 MED ORDER — ACETAMINOPHEN 325 MG PO TABS
650.00 | ORAL_TABLET | ORAL | Status: DC
Start: ? — End: 2019-03-27

## 2019-03-27 MED ORDER — SODIUM CHLORIDE 0.9 % IV SOLN
10.00 | INTRAVENOUS | Status: DC
Start: ? — End: 2019-03-27

## 2019-03-27 MED ORDER — ALUM & MAG HYDROXIDE-SIMETH 200-200-20 MG/5ML PO SUSP
30.00 | ORAL | Status: DC
Start: ? — End: 2019-03-27

## 2019-03-27 MED ORDER — DEXAMETHASONE SODIUM PHOSPHATE 4 MG/ML IJ SOLN
6.00 | INTRAMUSCULAR | Status: DC
Start: 2019-03-27 — End: 2019-03-27

## 2019-03-27 MED ORDER — Medication
Status: DC
Start: ? — End: 2019-03-27

## 2019-03-27 MED ORDER — AMLODIPINE BESYLATE 5 MG PO TABS
5.00 | ORAL_TABLET | ORAL | Status: DC
Start: 2019-03-27 — End: 2019-03-27

## 2019-03-27 MED ORDER — NITROGLYCERIN 0.4 MG SL SUBL
0.40 | SUBLINGUAL_TABLET | SUBLINGUAL | Status: DC
Start: ? — End: 2019-03-27

## 2019-03-27 MED ORDER — ENOXAPARIN SODIUM 40 MG/0.4ML ~~LOC~~ SOLN
0.50 | SUBCUTANEOUS | Status: DC
Start: 2019-03-26 — End: 2019-03-27

## 2019-03-27 MED ORDER — ALBUTEROL SULFATE HFA 108 (90 BASE) MCG/ACT IN AERS
2.00 | INHALATION_SPRAY | RESPIRATORY_TRACT | Status: DC
Start: ? — End: 2019-03-27

## 2019-03-27 NOTE — Telephone Encounter (Signed)
Patient advised of recommendations.   States that he is having some issues with his incentive spirometry. Patient has been added to Dr Gardiner Ramus schedule tomorrow for virtual follow up to discuss recovery process

## 2019-03-27 NOTE — Telephone Encounter (Signed)
Clinton Cox is having trouble breathing after a coughing spell. He was released from the hospital with steroids. He was advised to rest. He is having episodes of 91% o2. At the moment his o2 is 96% and heart rate is 90 BPM.

## 2019-03-27 NOTE — Telephone Encounter (Signed)
Patient called in stating that he was seen at [facility] and tested positive for covid and has double pneumonia in both lungs. Went in on Friday and was discharged yesterday. Needs a hospital follow up within 48hours. Please advise.

## 2019-03-27 NOTE — Telephone Encounter (Signed)
OK, stay as active as possible.  Try not to lay around too much.  It helps keep the lungs nice and open.  Definitely keep an eye on the pulse ox and make sure hydrating well especially with pneumonia.  If any point he gets worse let us know either go to the ED if it is after hours or on the weekend.

## 2019-03-28 ENCOUNTER — Encounter: Payer: Self-pay | Admitting: Family Medicine

## 2019-03-28 ENCOUNTER — Ambulatory Visit (INDEPENDENT_AMBULATORY_CARE_PROVIDER_SITE_OTHER): Payer: BLUE CROSS/BLUE SHIELD | Admitting: Family Medicine

## 2019-03-28 VITALS — BP 128/78 | HR 96 | Temp 97.6°F | Ht 72.0 in

## 2019-03-28 DIAGNOSIS — J189 Pneumonia, unspecified organism: Secondary | ICD-10-CM | POA: Diagnosis not present

## 2019-03-28 DIAGNOSIS — U071 COVID-19: Secondary | ICD-10-CM

## 2019-03-28 DIAGNOSIS — R05 Cough: Secondary | ICD-10-CM | POA: Diagnosis not present

## 2019-03-28 DIAGNOSIS — R059 Cough, unspecified: Secondary | ICD-10-CM

## 2019-03-28 MED ORDER — GENERIC EXTERNAL MEDICATION
Status: DC
Start: ? — End: 2019-03-28

## 2019-03-28 NOTE — Progress Notes (Signed)
lvm advising pt that I was calling to do his prescreening.Maryruth Eve, Lahoma Crocker, CMA
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
## 2019-04-01 ENCOUNTER — Encounter: Payer: Self-pay | Admitting: Family Medicine

## 2019-04-04 ENCOUNTER — Ambulatory Visit (INDEPENDENT_AMBULATORY_CARE_PROVIDER_SITE_OTHER): Payer: BLUE CROSS/BLUE SHIELD | Admitting: Family Medicine

## 2019-04-04 ENCOUNTER — Encounter: Payer: Self-pay | Admitting: Family Medicine

## 2019-04-04 ENCOUNTER — Telehealth: Payer: Self-pay | Admitting: Family Medicine

## 2019-04-04 ENCOUNTER — Other Ambulatory Visit: Payer: Self-pay

## 2019-04-04 VITALS — HR 80 | Temp 97.9°F

## 2019-04-04 DIAGNOSIS — R0781 Pleurodynia: Secondary | ICD-10-CM | POA: Diagnosis not present

## 2019-04-04 DIAGNOSIS — U071 COVID-19: Secondary | ICD-10-CM | POA: Diagnosis not present

## 2019-04-04 NOTE — Telephone Encounter (Signed)
Clinton Cox called with some concerns. His main concern was a lot of right side pain. He is not sure if he should schedule an appt. or to keep doing what he is doing.If someone could reach out to him, he would like that.

## 2019-04-04 NOTE — Telephone Encounter (Signed)
appt scheduled.Maryruth Eve, Lahoma Crocker, CMA

## 2019-04-04 NOTE — Progress Notes (Signed)
lvm advising pt that I was calling to do the prescreening prior to his appt.Clinton Cox, Lahoma Crocker, CMA   He stated that the pain

## 2019-04-04 NOTE — Progress Notes (Signed)
Virtual Visit via Video Note  I connected with Clinton Cox on 04/04/19 at  2:00 PM EST by a video enabled telemedicine application and verified that I am speaking with the correct person using two identifiers.   I discussed the limitations of evaluation and management by telemedicine and the availability of in person appointments. The patient expressed understanding and agreed to proceed.  Subjective:    CC: Chest Pain   HPI: 53 year old male who was positive for Covid complains today of right lower anterior chest pain mostly over the rib cage.  He says it is really been bothering him since he got home from the hospital probably around November 9.  But is gradually become a little bit more bothersome.  He says it is painful if he coughs.  It feels like a fullness or pressure sensation.  He says it is uncomfortable if he moves he can usually finally get situated where it does not hurt and is usually able to get comfortable at night and sleep.  He has been using ibuprofen and yesterday he did try heating pad.  He has not noticed any skin rash.  No swelling of the extremities.  No fevers.  Has had normal bowel movements.  No blood in the stool.  No significant nausea.  Still coughing some but says it is better than when I did a office visit with him about a week ago.  Says it is painful if he presses on the area.   Past medical history, Surgical history, Family history not pertinant except as noted below, Social history, Allergies, and medications have been entered into the medical record, reviewed, and corrections made.   Review of Systems: No fevers, chills, night sweats, weight loss, chest pain, or shortness of breath.   Objective:    General: Speaking clearly in complete sentences without any shortness of breath.  Alert and oriented x3.  Normal judgment. No apparent acute distress.  Pointing to his right anterior lower rib area from where it hurts.    Impression and Recommendations:    Right anterior rib pain-suspect that he may have a rib fracture from coughing or rib strain again might likely from his persistent forceful cough especially since it hurts with taking a deep breath, coughing and movement.  It does not sound like any type of intestinal or liver issue.  No fever, nausea etc.  Encouraged him to continue with ibuprofen, heating pad and okay to use lidocaine patch.  If not improving over the next 10 days then please let us know.  Certainly if develops new symptoms or worsens and please give [place] a call sooner.    I discussed the assessment and treatment plan with the patient. The patient was provided an opportunity to ask questions and all were answered. The patient agreed with the plan and demonstrated an understanding of the instructions.   The patient was advised to call back or seek an in-person evaluation if the symptoms worsen or if the condition fails to improve as anticipated.   Beatrice Lecher, MD

## 2019-04-07 ENCOUNTER — Other Ambulatory Visit: Payer: Self-pay | Admitting: Family Medicine

## 2019-04-07 DIAGNOSIS — J069 Acute upper respiratory infection, unspecified: Secondary | ICD-10-CM

## 2019-04-12 ENCOUNTER — Encounter: Payer: Self-pay | Admitting: Family Medicine

## 2019-04-12 ENCOUNTER — Ambulatory Visit (INDEPENDENT_AMBULATORY_CARE_PROVIDER_SITE_OTHER): Payer: BLUE CROSS/BLUE SHIELD | Admitting: Family Medicine

## 2019-04-12 VITALS — HR 89 | Temp 98.0°F | Ht 72.0 in | Wt 203.0 lb

## 2019-04-12 DIAGNOSIS — R059 Cough, unspecified: Secondary | ICD-10-CM

## 2019-04-12 DIAGNOSIS — J189 Pneumonia, unspecified organism: Secondary | ICD-10-CM | POA: Diagnosis not present

## 2019-04-12 DIAGNOSIS — U071 COVID-19: Secondary | ICD-10-CM

## 2019-04-12 DIAGNOSIS — R05 Cough: Secondary | ICD-10-CM

## 2019-04-12 DIAGNOSIS — J3489 Other specified disorders of nose and nasal sinuses: Secondary | ICD-10-CM

## 2019-04-12 MED ORDER — AZITHROMYCIN 250 MG PO TABS: ORAL_TABLET | ORAL | 0 refills | Status: AC

## 2019-04-12 NOTE — Progress Notes (Signed)
Patient reports a lot of clear when he coughs,   He has blown his nose and it has a yellowish to slight red color when he blows hard.

## 2019-04-12 NOTE — Progress Notes (Signed)
Virtual Visit via Video Note  I connected with Clinton Cox on 04/12/19 at  2:40 PM EST by a video enabled telemedicine application and verified that I am speaking with the correct person using two identifiers.   I discussed the limitations of evaluation and management by telemedicine and the availability of in person appointments. The patient expressed understanding and agreed to proceed.  Subjective:    CC: resp sxs.    HPI: 53 year old male is calling today because of respiratory symptoms.  He was actually admitted for Covid on November 6.  He was diagnosed with double pneumonia and since then has been getting progressively better but he called today because he felt like his cough was actually getting a little worse today.  He feels like it is a deeper cough he has been able to clear his throat and get a little bit of white clear phlegm.  He has been using his inspirometer and says he is actually been doing really well with that he said he actually walked a mile yesterday.  After exercise his pulse ox was still 95%.  At rest it is been 98%.  He is also had some pain at the base of his lungs and just wanted to mention that it is bilateral mid back area.  He says it is worse with coughing and deep breath.  Ibuprofen does seem to help so he has been using that.  He also says that over the last couple days he has noticed he has not been able to sneeze.  He says that when he needs to sneeze he feels like a "balloon is getting full" in his chest and he cannot finish the sneezing says this is not normal for him.  He does report that he has had some yellow mucus from his nose but mostly been light yellow and occasionally little bit of blood.    Past medical history, Surgical history, Family history not pertinant except as noted below, Social history, Allergies, and medications have been entered into the medical record, reviewed, and corrections made.   Review of Systems: No fevers, chills, night  sweats, weight loss, chest pain, or shortness of breath.   Objective:    General: Speaking clearly in complete sentences without any shortness of breath.  Alert and oriented x3.  Normal judgment. No apparent acute distress.    Impression and Recommendations:   COVID-19/bilateral pneumonia-discussed that I think that his symptoms are partially the course of her having had double pneumonia 3 weeks ago.  I think he still can have some good days and bad days overall.  Because we are heading into a long holiday weekend and we will not be here for the next 4 days I am to go ahead and send over a Z-Pak.  We discussed that if he continues to have increased cough and sputum production over the next 3 days or increased drainage from the sinuses then okay to fill the prescription otherwise recommend he just continue symptomatic care.  He can let us know on Monday if he still having persistent symptoms.     I discussed the assessment and treatment plan with the patient. The patient was provided an opportunity to ask questions and all were answered. The patient agreed with the plan and demonstrated an understanding of the instructions.   The patient was advised to call back or seek an in-person evaluation if the symptoms worsen or if the condition fails to improve as anticipated.   Beatrice Lecher, MD

## 2019-04-22 ENCOUNTER — Telehealth: Payer: Self-pay | Admitting: Family Medicine

## 2019-04-22 MED ORDER — FLUOXETINE HCL 10 MG PO CAPS: 10.0000 mg | ORAL_CAPSULE | Freq: Every day | ORAL | 1 refills | Status: DC

## 2019-04-22 NOTE — Telephone Encounter (Signed)
After hours nurse called.  Needs a refill on the fluoxetine 10mg  instead of the 20mg  bc had stopped med for awhile.

## 2019-05-16 ENCOUNTER — Other Ambulatory Visit: Payer: Self-pay | Admitting: Family Medicine

## 2019-05-24 ENCOUNTER — Encounter: Payer: Self-pay | Admitting: Family Medicine

## 2019-05-24 ENCOUNTER — Other Ambulatory Visit: Payer: Self-pay

## 2019-05-24 ENCOUNTER — Ambulatory Visit (INDEPENDENT_AMBULATORY_CARE_PROVIDER_SITE_OTHER): Payer: BC Managed Care – PPO | Admitting: Family Medicine

## 2019-05-24 ENCOUNTER — Ambulatory Visit (INDEPENDENT_AMBULATORY_CARE_PROVIDER_SITE_OTHER): Payer: BC Managed Care – PPO

## 2019-05-24 VITALS — BP 124/80 | HR 91 | Ht 72.0 in | Wt 208.0 lb

## 2019-05-24 DIAGNOSIS — M79629 Pain in unspecified upper arm: Secondary | ICD-10-CM | POA: Diagnosis not present

## 2019-05-24 DIAGNOSIS — R0781 Pleurodynia: Secondary | ICD-10-CM

## 2019-05-24 DIAGNOSIS — F410 Panic disorder [episodic paroxysmal anxiety] without agoraphobia: Secondary | ICD-10-CM

## 2019-05-24 DIAGNOSIS — R002 Palpitations: Secondary | ICD-10-CM | POA: Diagnosis not present

## 2019-05-24 LAB — CBC WITH DIFFERENTIAL/PLATELET
Absolute Monocytes: 693 cells/uL (ref 200–950)
Basophils Absolute: 42 cells/uL (ref 0–200)
Basophils Relative: 0.6 %
Eosinophils Absolute: 49 cells/uL (ref 15–500)
Eosinophils Relative: 0.7 %
HCT: 45.4 % (ref 38.5–50.0)
Hemoglobin: 15.6 g/dL (ref 13.2–17.1)
Lymphs Abs: 1554 cells/uL (ref 850–3900)
MCH: 31.3 pg (ref 27.0–33.0)
MCHC: 34.4 g/dL (ref 32.0–36.0)
MCV: 91.2 fL (ref 80.0–100.0)
MPV: 8.9 fL (ref 7.5–12.5)
Monocytes Relative: 9.9 %
Neutro Abs: 4662 cells/uL (ref 1500–7800)
Neutrophils Relative %: 66.6 %
Platelets: 341 10*3/uL (ref 140–400)
RBC: 4.98 10*6/uL (ref 4.20–5.80)
RDW: 12.3 % (ref 11.0–15.0)
Total Lymphocyte: 22.2 %
WBC: 7 10*3/uL (ref 3.8–10.8)

## 2019-05-24 LAB — SEDIMENTATION RATE: Sed Rate: 2 mm/h (ref 0–20)

## 2019-05-24 MED ORDER — FLUOXETINE HCL 20 MG PO CAPS: 20.0000 mg | ORAL_CAPSULE | Freq: Every day | ORAL | 0 refills | Status: DC

## 2019-05-24 NOTE — Assessment & Plan Note (Signed)
Okay to restart the fluoxetine 20 mg.  Follow-up in 3 months.  Will update medication list.

## 2019-05-24 NOTE — Assessment & Plan Note (Addendum)
He does have palpitations chronically and has for years but just feels like it has increased in frequency since he developed Covid.  Did do an EKG today.  EKG interpretation: Rate of 91 bpm, normal sinus rhythm with no acute ST-T wave changes.  No sign of PVCs or PACs.
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
## 2019-05-25 NOTE — Progress Notes (Signed)
All labs are normal. 

## 2019-06-03 ENCOUNTER — Encounter: Payer: Self-pay | Admitting: Family Medicine

## 2019-06-03 DIAGNOSIS — M79629 Pain in unspecified upper arm: Secondary | ICD-10-CM

## 2019-06-05 NOTE — Telephone Encounter (Signed)
Order signed thank you

## 2019-06-05 NOTE — Telephone Encounter (Signed)
Order Sent - CF

## 2019-06-05 NOTE — Telephone Encounter (Signed)
Please see pt note. Last OV states "We will move forward with ultrasound of the right axilla as that side is worse. "   I have pended order, please sign if OK. I will have patient contacted for scan ASAP if efforts to get done prior to follow up on 20th. Discussed with Bonnita Nasuti and this will have to be done at [facility] center.

## 2019-06-07 ENCOUNTER — Ambulatory Visit (INDEPENDENT_AMBULATORY_CARE_PROVIDER_SITE_OTHER): Payer: BC Managed Care – PPO | Admitting: Family Medicine

## 2019-06-07 DIAGNOSIS — Z5320 Procedure and treatment not carried out because of patient's decision for unspecified reasons: Secondary | ICD-10-CM

## 2019-06-07 NOTE — Progress Notes (Signed)
No charge.. Patient really wanted to follow-up after his ultrasound but was not able to get that scheduled beforehand so there was a miscommunication about the visit today.

## 2019-06-20 ENCOUNTER — Other Ambulatory Visit: Payer: Self-pay | Admitting: Family Medicine

## 2019-06-20 DIAGNOSIS — M79629 Pain in unspecified upper arm: Secondary | ICD-10-CM

## 2019-07-04 ENCOUNTER — Telehealth: Payer: Self-pay | Admitting: Family Medicine

## 2019-07-04 ENCOUNTER — Encounter: Payer: Self-pay | Admitting: Family Medicine

## 2019-07-04 ENCOUNTER — Telehealth (INDEPENDENT_AMBULATORY_CARE_PROVIDER_SITE_OTHER): Payer: BC Managed Care – PPO | Admitting: Family Medicine

## 2019-07-04 VITALS — Wt 206.0 lb

## 2019-07-04 DIAGNOSIS — R202 Paresthesia of skin: Secondary | ICD-10-CM | POA: Diagnosis not present

## 2019-07-04 DIAGNOSIS — M79629 Pain in unspecified upper arm: Secondary | ICD-10-CM | POA: Insufficient documentation

## 2019-07-04 DIAGNOSIS — F411 Generalized anxiety disorder: Secondary | ICD-10-CM | POA: Diagnosis not present

## 2019-07-04 DIAGNOSIS — R5383 Other fatigue: Secondary | ICD-10-CM | POA: Diagnosis not present

## 2019-07-04 HISTORY — DX: Paresthesia of skin: R20.2

## 2019-07-04 HISTORY — DX: Other fatigue: R53.83

## 2019-07-04 HISTORY — DX: Generalized anxiety disorder: F41.1

## 2019-07-04 NOTE — Assessment & Plan Note (Addendum)
I do think most of his sxs are triggered by anxiety.  We discussed increasing fluoxetine to 40mg  and plan to f/u in about 2-3 weeks and then can discuss further working with a therapist.  Dsicussed that benzo are not a daily tx for his anxiety they are highly addictive and can inc risk for dementia.
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
## 2019-07-04 NOTE — Assessment & Plan Note (Signed)
sxs are bilat and occur with sweating.  consistant with panic attacks.

## 2019-07-04 NOTE — Assessment & Plan Note (Signed)
Still considering additional imaging.  Had normal CT while in the hospital so tried to give hin reassurance.

## 2019-07-04 NOTE — Telephone Encounter (Signed)
MyChart sent.

## 2019-07-04 NOTE — Assessment & Plan Note (Signed)
Having 30-60 min episodes that seem to occur at rest.  No cardiac or pulmonary sxs.  I think again stress triggered. Will recheck his TSH.

## 2019-07-10 MED ORDER — FLUOXETINE HCL 20 MG PO CAPS: 20.0000 mg | ORAL_CAPSULE | Freq: Every day | ORAL | 0 refills | Status: DC

## 2019-07-10 NOTE — Addendum Note (Signed)
Addended by: Towana Badger on: 07/10/2019 03:16 PM   Modules accepted: Orders

## 2019-07-11 DIAGNOSIS — R5383 Other fatigue: Secondary | ICD-10-CM | POA: Diagnosis not present

## 2019-07-11 DIAGNOSIS — R202 Paresthesia of skin: Secondary | ICD-10-CM | POA: Diagnosis not present

## 2019-07-12 LAB — COMPLETE METABOLIC PANEL WITH GFR
AG Ratio: 1.7 (calc) (ref 1.0–2.5)
ALT: 25 U/L (ref 9–46)
AST: 21 U/L (ref 10–35)
Albumin: 4.5 g/dL (ref 3.6–5.1)
Alkaline phosphatase (APISO): 62 U/L (ref 35–144)
BUN: 8 mg/dL (ref 7–25)
CO2: 30 mmol/L (ref 20–32)
Calcium: 9.7 mg/dL (ref 8.6–10.3)
Chloride: 101 mmol/L (ref 98–110)
Creat: 1 mg/dL (ref 0.70–1.33)
GFR, Est African American: 99 mL/min/{1.73_m2} (ref >=60)
GFR, Est Non African American: 86 mL/min/{1.73_m2} (ref >=60)
Globulin: 2.6 g/dL (calc) (ref 1.9–3.7)
Glucose, Bld: 93 mg/dL (ref 65–99)
Potassium: 4.3 mmol/L (ref 3.5–5.3)
Sodium: 140 mmol/L (ref 135–146)
Total Bilirubin: 0.8 mg/dL (ref 0.2–1.2)
Total Protein: 7.1 g/dL (ref 6.1–8.1)

## 2019-07-12 LAB — URINALYSIS, ROUTINE W REFLEX MICROSCOPIC
Bilirubin Urine: NEGATIVE
Glucose, UA: NEGATIVE
Hgb urine dipstick: NEGATIVE
Ketones, ur: NEGATIVE
Leukocytes,Ua: NEGATIVE
Nitrite: NEGATIVE
Protein, ur: NEGATIVE
Specific Gravity, Urine: 1.005 (ref 1.001–1.03)
pH: 7.5 (ref 5.0–8.0)

## 2019-07-12 LAB — TSH: TSH: 3.35 mIU/L (ref 0.40–4.50)

## 2019-07-12 NOTE — Progress Notes (Signed)
All labs are normal. 

## 2019-07-14 ENCOUNTER — Encounter: Payer: Self-pay | Admitting: Family Medicine

## 2019-07-17 ENCOUNTER — Encounter: Payer: Self-pay | Admitting: Family Medicine

## 2019-07-18 NOTE — Telephone Encounter (Signed)
See med list. It was sent on 2/22. Call pharmacy to verify they have received it.

## 2019-07-19 NOTE — Telephone Encounter (Signed)
Called pharmacy, they did not have these on file. Gave verbal to Allie, CPhT for Fluoxetine 20 mg 1 QD.   FYI to PCP. Patient has been updated.

## 2019-07-27 ENCOUNTER — Encounter: Payer: Self-pay | Admitting: Family Medicine

## 2019-07-28 ENCOUNTER — Encounter: Payer: Self-pay | Admitting: Family Medicine

## 2019-07-28 ENCOUNTER — Telehealth (INDEPENDENT_AMBULATORY_CARE_PROVIDER_SITE_OTHER): Payer: BC Managed Care – PPO | Admitting: Family Medicine

## 2019-07-28 VITALS — Ht 72.0 in | Wt 206.0 lb

## 2019-07-28 DIAGNOSIS — H9313 Tinnitus, bilateral: Secondary | ICD-10-CM | POA: Diagnosis not present

## 2019-07-28 DIAGNOSIS — F411 Generalized anxiety disorder: Secondary | ICD-10-CM

## 2019-07-28 HISTORY — DX: Tinnitus, bilateral: H93.13

## 2019-07-28 MED ORDER — FLUOXETINE HCL 40 MG PO CAPS: 40.0000 mg | ORAL_CAPSULE | Freq: Every day | ORAL | 1 refills | Status: DC

## 2019-07-28 MED ORDER — DIAZEPAM 5 MG PO TABS: 2.5000 mg | ORAL_TABLET | Freq: Every day | ORAL | 0 refills | Status: DC | PRN

## 2019-07-28 NOTE — Assessment & Plan Note (Signed)
Discussed option. Stress adjusting the SSRI to maximize control of anxiety symptoms and often it takes higher doses to do that.  So we will increase fluoxetine to 40 mg.  In addition did prescribe a small amount of diazepam but reminded him again that this needs to be used very sparingly.  Again warned about chemical dependency and that it can sometimes actually cause a rebound phenomenon especially if it is taken more frequently.  The diazepam should also help with the tinnitus he is currently experiencing.

## 2019-07-28 NOTE — Assessment & Plan Note (Signed)
He feels like his symptoms are directly related to his anxiety levels.  He says on Wednesday when he felt really good it actually seem to go away for a while.  Also explained that simple can just get ear ringing that is completely unrelated to anxiety though anxiety can sometimes amplify the intensity of the ringing.  If it persists and I really like to get him back in with his ear nose and throat specialist, Dr. Leroy Sea for further evaluation.  And is not having any infection or pain symptoms currently which is reassuring.

## 2019-07-28 NOTE — Progress Notes (Signed)
Virtual Visit via Video Note  I connected with DOMENICO MCCLARY on 07/28/19 at 10:30 AM EST by a video enabled telemedicine application and verified that I am speaking with the correct person using two identifiers.   I discussed the limitations of evaluation and management by telemedicine and the availability of in person appointments. The patient expressed understanding and agreed to proceed.  Subjective:    CC:   HPI: 5 days of tinnitus that is constant bilaterally. Says has had on and off before but not persistent.  Has been trying anxiety relief medication. Tried an ear candle and some ear oil.  No pain or popping.  Seems to coincide with how he feels.  Went to lunch earlier this week and felt li,e his head and hands were sweating.  Has had before but only lasts about 10 minutes and feels really tired afterwards.  Says he really feels like the ear ringing seem to be related to when he is more stressed.  No trauma.    Has been taking his Fluoxetine 20mg  regularly.    Past medical history, Surgical history, Family history not pertinant except as noted below, Social history, Allergies, and medications have been entered into the medical record, reviewed, and corrections made.   Review of Systems: No fevers, chills, night sweats, weight loss, chest pain, or shortness of breath.   Objective:    General: Speaking clearly in complete sentences without any shortness of breath.  Alert and oriented x3.  Normal judgment. No apparent acute distress.    Impression and Recommendations:    GAD (generalized anxiety disorder) Discussed option. Stress adjusting the SSRI to maximize control of anxiety symptoms and often it takes higher doses to do that.  So we will increase fluoxetine to 40 mg.  In addition did prescribe a small amount of diazepam but reminded him again that this needs to be used very sparingly.  Again warned about chemical dependency and that it can sometimes actually cause a rebound  phenomenon especially if it is taken more frequently.  The diazepam should also help with the tinnitus he is currently experiencing.  Tinnitus of both ears He feels like his symptoms are directly related to his anxiety levels.  He says on Wednesday when he felt really good it actually seem to go away for a while.  Also explained that simple can just get ear ringing that is completely unrelated to anxiety though anxiety can sometimes amplify the intensity of the ringing.  If it persists and I really like to get him back in with his ear nose and throat specialist, Dr. Leroy Sea for further evaluation.  And is not having any infection or pain symptoms currently which is reassuring.      Time spent in encounter 20 minutes  I discussed the assessment and treatment plan with the patient. The patient was provided an opportunity to ask questions and all were answered. The patient agreed with the plan and demonstrated an understanding of the instructions.   The patient was advised to call back or seek an in-person evaluation if the symptoms worsen or if the condition fails to improve as anticipated.   Beatrice Lecher, MD

## 2019-08-01 ENCOUNTER — Encounter: Payer: Self-pay | Admitting: Family Medicine

## 2019-08-15 ENCOUNTER — Telehealth (INDEPENDENT_AMBULATORY_CARE_PROVIDER_SITE_OTHER): Payer: BC Managed Care – PPO | Admitting: Family Medicine

## 2019-08-15 ENCOUNTER — Encounter: Payer: Self-pay | Admitting: Family Medicine

## 2019-08-15 VITALS — BP 136/91 | HR 84

## 2019-08-15 DIAGNOSIS — H9313 Tinnitus, bilateral: Secondary | ICD-10-CM

## 2019-08-15 DIAGNOSIS — M79629 Pain in unspecified upper arm: Secondary | ICD-10-CM | POA: Diagnosis not present

## 2019-08-15 DIAGNOSIS — F411 Generalized anxiety disorder: Secondary | ICD-10-CM | POA: Diagnosis not present

## 2019-08-15 NOTE — Assessment & Plan Note (Addendum)
still occurring.  Not as bothersome...feels he is able to "tolerate" it better.  Will hold off for now on referral to Dr. Leroy Sea.

## 2019-08-15 NOTE — Progress Notes (Signed)
Advised patient of locations for COVID vaccination.   Pt states he feels fidgety and shakey almost like he'd talem speed or something. He did start taking Fluoxetine 40mg  from 20mg 

## 2019-08-15 NOTE — Assessment & Plan Note (Signed)
Still having 2-3 days per week where doesn't feel good. Has taken the Valium about 3 times since I last saw him.  Discussed options. We can continue the 40mg  and see if improves after another week.  Sxs are not uncommon after ramp up in dose.  He started CBD Oil recently as well but stopped it yesterday. Wasn't sure if that was contributing to his sxs.

## 2019-08-15 NOTE — Progress Notes (Signed)
Virtual Visit via Video Note  I connected with Clinton Cox on 08/15/19 at  2:40 PM EDT by a video enabled telemedicine application and verified that I am speaking with the correct person using two identifiers.   I discussed the limitations of evaluation and management by telemedicine and the availability of in person appointments. The patient expressed understanding and agreed to proceed.  Subjective:    CC: F/U fluoxetine   HPI: Clinton Cox is a 54 year old male with a history of generalized anxiety disorder.  Reports that he increase his fluoxetine to 40 mg about 7 to 10 days ago.  Since then he has felt like he is taking speedier he feels very wired and fidgety.  Hands and feet are tingling and feel cold at times.  He has been getting some ear ringing.  He feels like the symptoms are disruptive to his sleep.  He has had some mild nausea come and go.  He also reports he has been getting some dry mouth and increased heartburn.  Still having ear ringing. Says he is tolerating is better.   Past medical history, Surgical history, Family history not pertinant except as noted below, Social history, Allergies, and medications have been entered into the medical record, reviewed, and corrections made.   Review of Systems: No fevers, chills, night sweats, weight loss, chest pain, or shortness of breath.   Objective:    General: Speaking clearly in complete sentences without any shortness of breath.  Alert and oriented x3.  Normal judgment. No apparent acute distress.    Impression and Recommendations:    GAD (generalized anxiety disorder) Still having 2-3 days per week where doesn't feel good. Has taken the Valium about 3 times since I last saw him.  Discussed options. We can continue the 40mg  and see if improves after another week.  Sxs are not uncommon after ramp up in dose.  He started CBD Oil recently as well but stopped it yesterday. Wasn't sure if that was contributing to his sxs.     Tinnitus of both ears still occurring.  Not as bothersome...feels he is able to "tolerate" it better.  Will hold off for now on referral to Dr. Leroy Sea.       Time spent in encounter 21 minutes  I discussed the assessment and treatment plan with the patient. The patient was provided an opportunity to ask questions and all were answered. The patient agreed with the plan and demonstrated an understanding of the instructions.   The patient was advised to call back or seek an in-person evaluation if the symptoms worsen or if the condition fails to improve as anticipated.   Beatrice Lecher, MD \

## 2019-08-17 ENCOUNTER — Other Ambulatory Visit: Payer: Self-pay

## 2019-08-17 ENCOUNTER — Ambulatory Visit
Admission: RE | Admit: 2019-08-17 | Discharge: 2019-08-17 | Disposition: A | Discharge status: Home / Self Care | Payer: BC Managed Care – PPO | Source: Ambulatory Visit | Attending: Family Medicine | Admitting: Family Medicine

## 2019-08-17 DIAGNOSIS — M79629 Pain in unspecified upper arm: Secondary | ICD-10-CM

## 2019-08-17 DIAGNOSIS — R928 Other abnormal and inconclusive findings on diagnostic imaging of breast: Secondary | ICD-10-CM | POA: Diagnosis not present

## 2019-08-17 DIAGNOSIS — N644 Mastodynia: Secondary | ICD-10-CM | POA: Diagnosis not present

## 2019-08-19 ENCOUNTER — Other Ambulatory Visit: Payer: Self-pay | Admitting: Family Medicine

## 2019-08-23 ENCOUNTER — Encounter: Payer: Self-pay | Admitting: Family Medicine

## 2019-08-23 MED ORDER — FLUOXETINE HCL 20 MG PO CAPS: 20.0000 mg | ORAL_CAPSULE | Freq: Every day | ORAL | 1 refills | Status: DC

## 2019-10-06 ENCOUNTER — Other Ambulatory Visit: Payer: Self-pay | Admitting: Family Medicine

## 2019-11-14 IMAGING — DX DG CERVICAL SPINE COMPLETE 4+V
5 series · 5 of 5 positions shown · non-contrast
Comparison: None.

CLINICAL DATA: Chronic neck pain.

EXAM:
CERVICAL SPINE - COMPLETE 4+ VIEW

[c-spine lat]
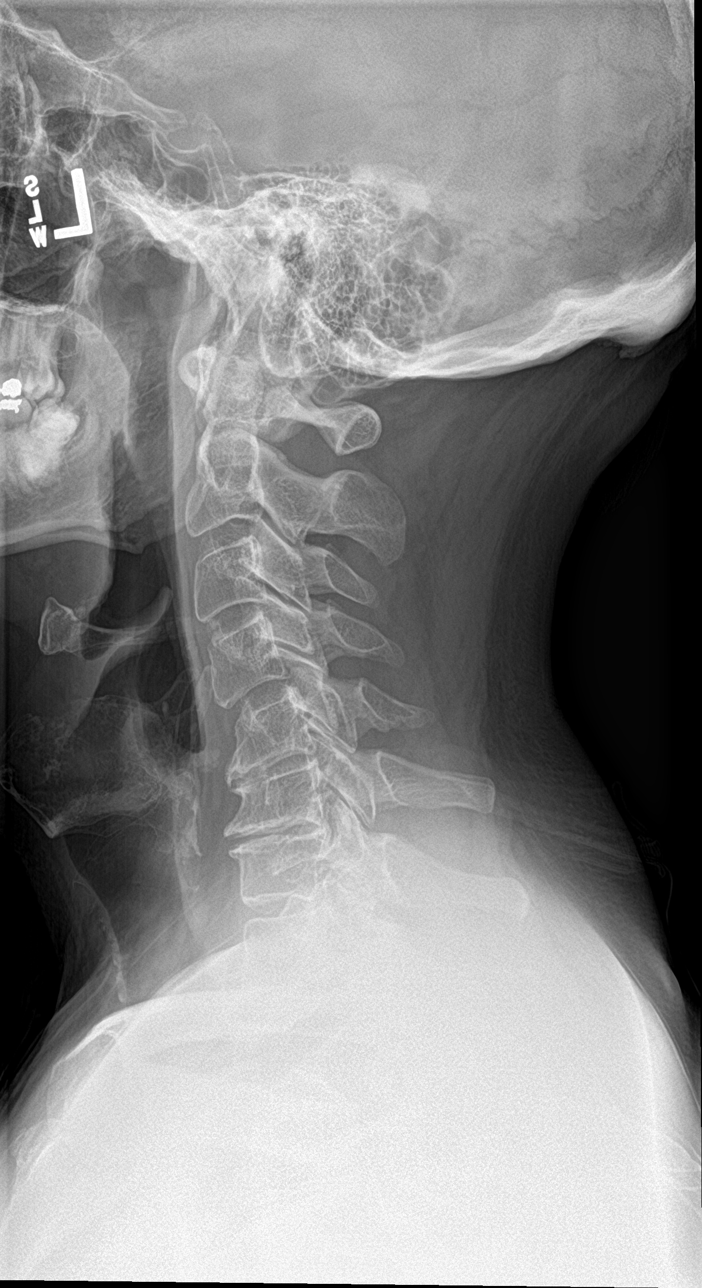

[c-spine obl (1 of 2)]
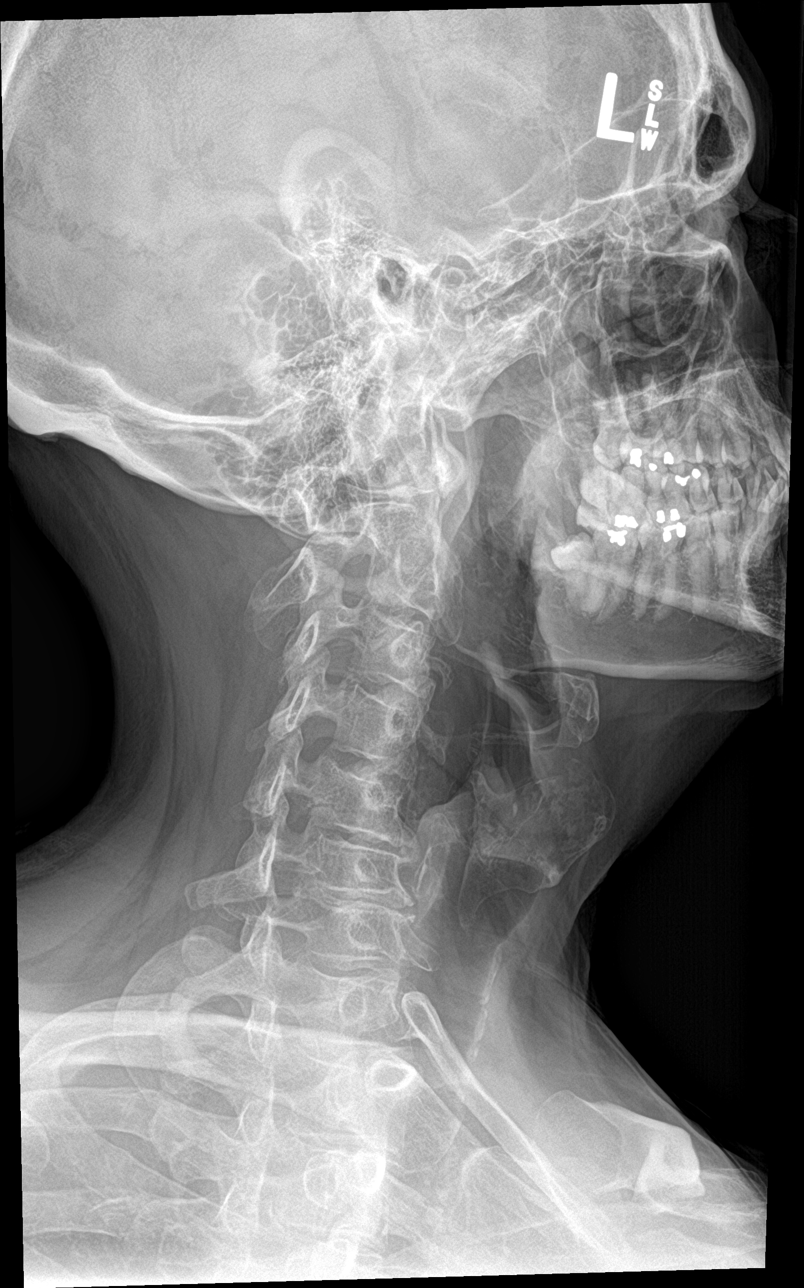

[c-spine obl (2 of 2)]
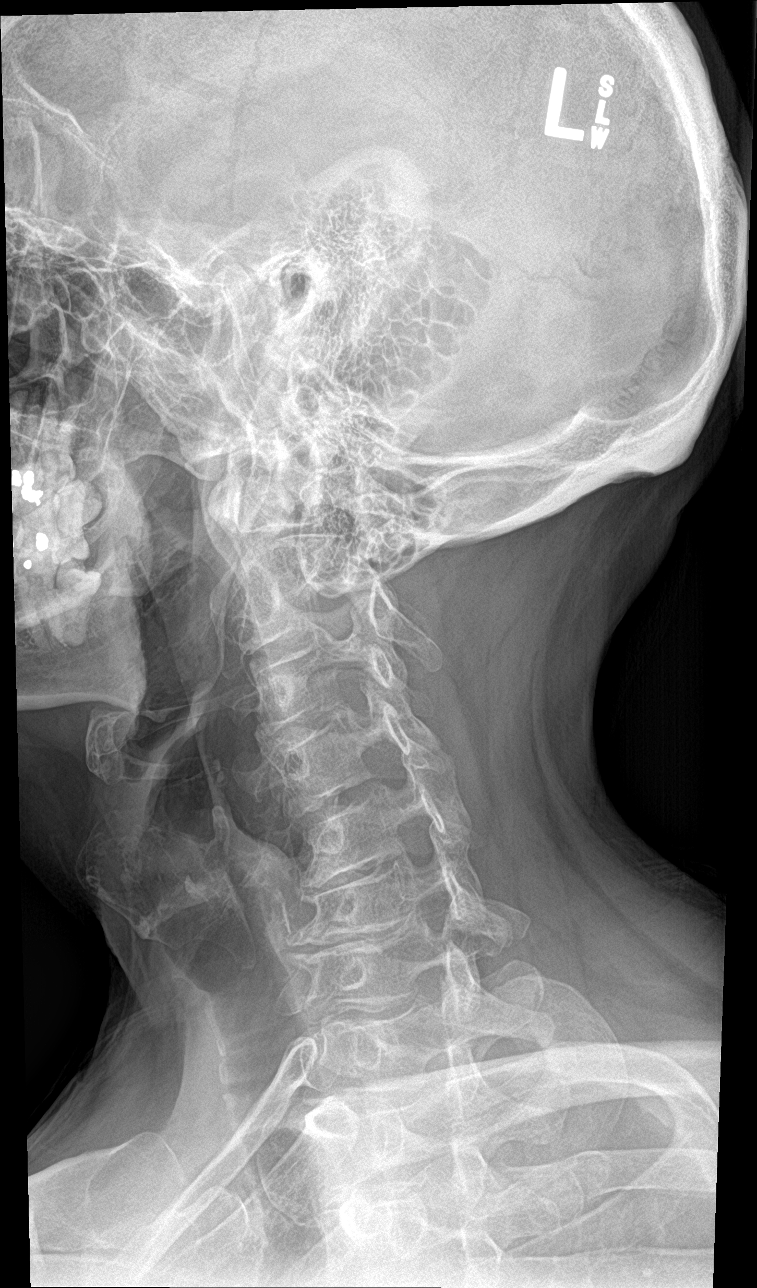

[c-spine ap]
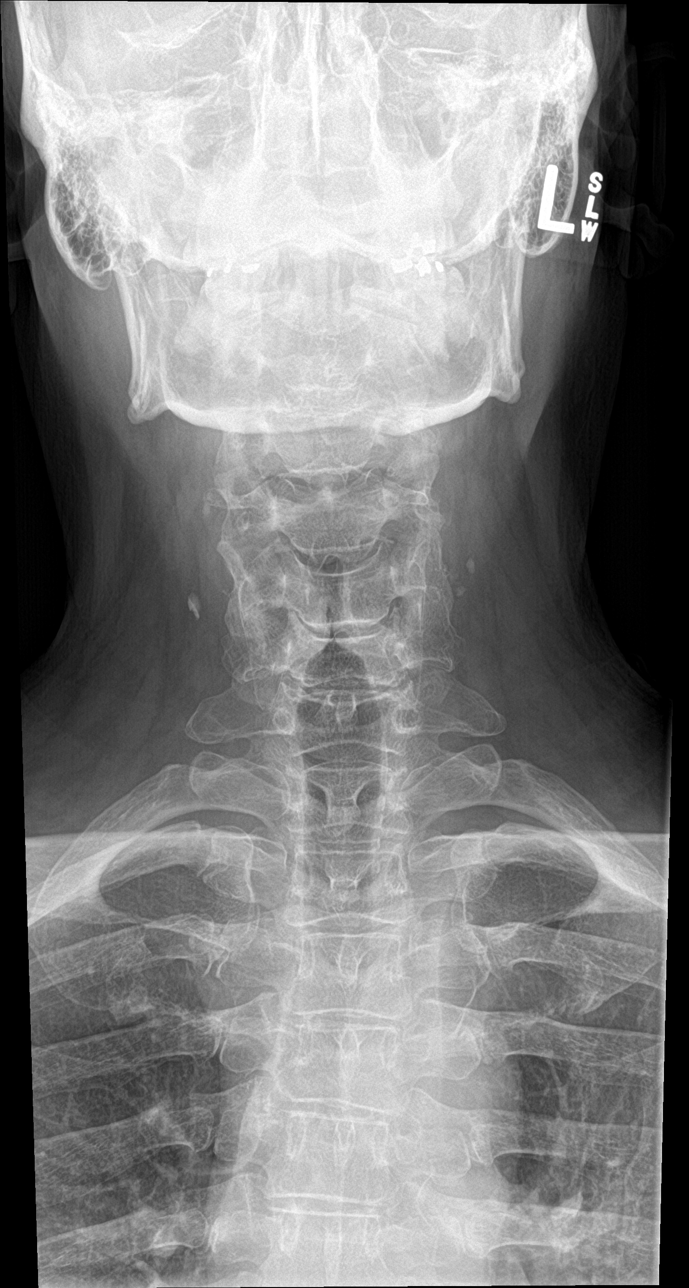

[c-spine open mouth]
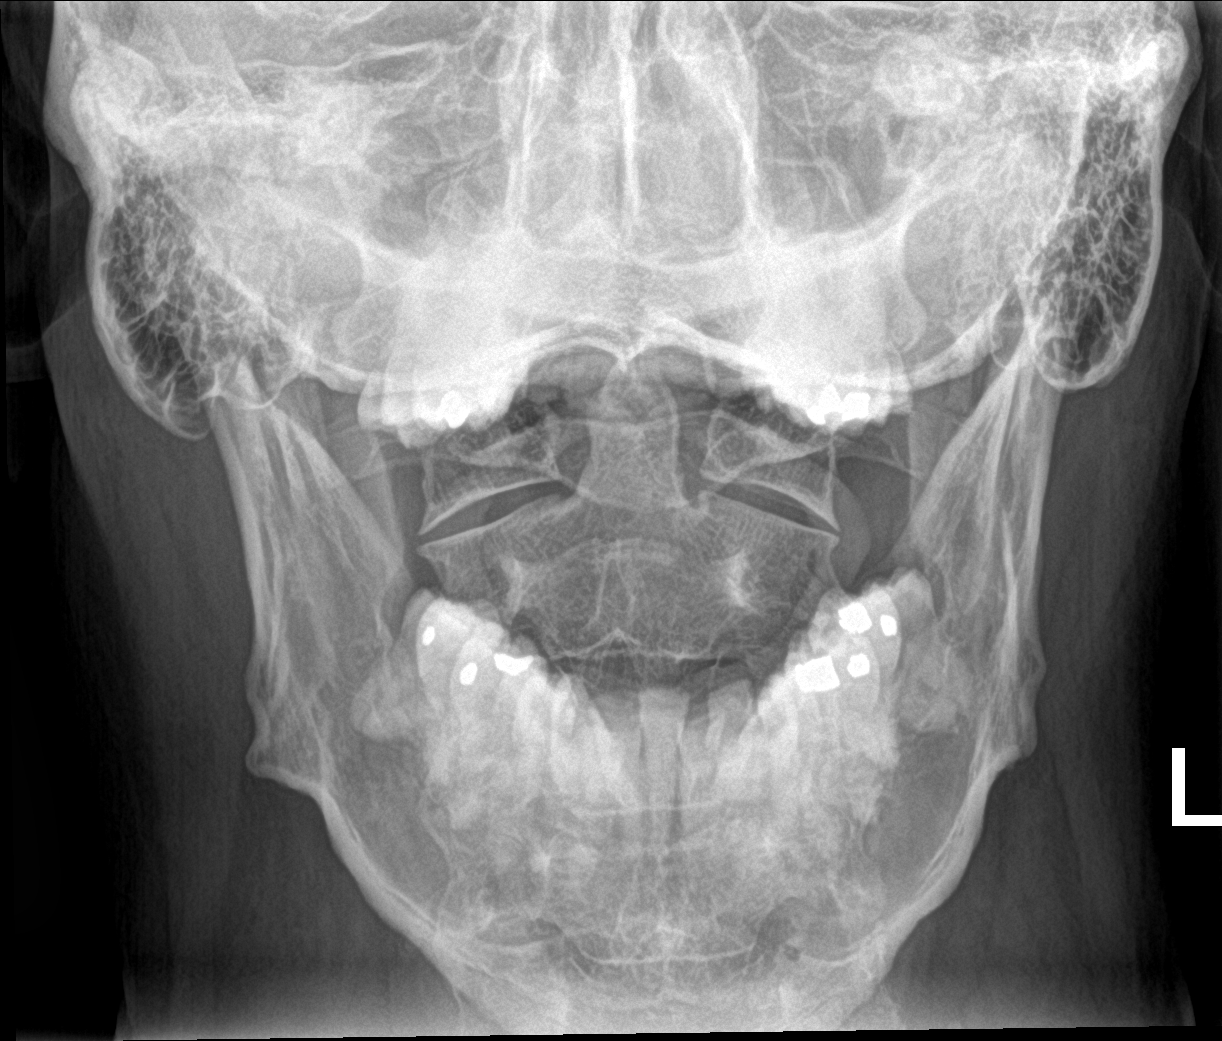

[5 of 5 positions shown; findings below may reference images not displayed]

FINDINGS: No fracture or spondylolisthesis is noted. Moderate degenerative
disc disease is noted at C5-6 and C6-7 with anterior osteophyte
formation. No significant neural foraminal stenosis is noted. No
prevertebral soft tissue swelling is noted.
IMPRESSION: Moderate multilevel degenerative disc disease. No acute abnormality
seen in the cervical spine.

## 2019-11-21 ENCOUNTER — Encounter: Payer: Self-pay | Admitting: Family Medicine

## 2019-11-21 ENCOUNTER — Ambulatory Visit (INDEPENDENT_AMBULATORY_CARE_PROVIDER_SITE_OTHER): Payer: BLUE CROSS/BLUE SHIELD | Admitting: Family Medicine

## 2019-11-21 VITALS — BP 125/79 | HR 77 | Ht 72.0 in | Wt 209.0 lb

## 2019-11-21 DIAGNOSIS — R251 Tremor, unspecified: Secondary | ICD-10-CM | POA: Diagnosis not present

## 2019-11-21 DIAGNOSIS — F411 Generalized anxiety disorder: Secondary | ICD-10-CM

## 2019-11-21 DIAGNOSIS — G250 Essential tremor: Secondary | ICD-10-CM

## 2019-11-21 DIAGNOSIS — L219 Seborrheic dermatitis, unspecified: Secondary | ICD-10-CM

## 2019-11-21 HISTORY — DX: Seborrheic dermatitis, unspecified: L21.9

## 2019-11-21 HISTORY — DX: Essential tremor: G25.0

## 2019-11-21 MED ORDER — CLOTRIMAZOLE-BETAMETHASONE 1-0.05 % EX CREA: 1.0000 "application " | TOPICAL_CREAM | Freq: Two times a day (BID) | CUTANEOUS | 0 refills | Status: DC | PRN

## 2019-11-21 MED ORDER — CLONAZEPAM 0.5 MG PO TABS: 0.5000 mg | ORAL_TABLET | Freq: Every day | ORAL | 0 refills | Status: DC | PRN

## 2019-11-21 MED ORDER — KETOCONAZOLE 2 % EX SHAM: 1.0000 "application " | MEDICATED_SHAMPOO | CUTANEOUS | 3 refills | Status: DC

## 2019-11-21 NOTE — Assessment & Plan Note (Addendum)
Tremors most consistent with essential tremor probable familial especially since he notes mom and brother have a tremor as well.  Unclear if this may have been triggered by recent diagnosis of Covid or if was already underlying and may have been exacerbated.  It does improve with alcohol and its a high-frequency tremor little bit worse in his right hand compared to the left.  Though sometimes he feels like he has a tremor inside his whole body.  We will rule out heavy metal toxicity and Wilson's disease as he does not have well water.  Recent TSH from February looked great.  Been able to walk 2 miles a day and lift weights without any significant weakness to suggest other underlying causes such as MS or ALS.  His labs are back consider trial of medication for essential tremor.  Would consider trial of primidone and or propranolol.

## 2019-11-21 NOTE — Progress Notes (Signed)
Pt reports that since having COVID he has noticed that his hands tend to shake more. He said that previously it would happen 1 time every 3-4 days now its daily upon waking

## 2019-11-21 NOTE — Assessment & Plan Note (Signed)
Also discussed options of switching to a different SSRI.  He stated he will consider it but for right now we will to stay with Clinton Cox change 20 mg did go ahead and refill his clonazepam.  20 tabs has lasted him over a year.  He will try to use sparingly.
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
## 2019-11-23 ENCOUNTER — Encounter: Payer: Self-pay | Admitting: Family Medicine

## 2019-11-23 LAB — HEAVY METALS PANEL, BLOOD
Arsenic: <10 mcg/L (ref <23)
Lead: 5 ug/dL — ABNORMAL HIGH (ref <5)
Mercury, B: <5 mcg/L (ref 0–10)

## 2019-11-23 LAB — CERULOPLASMIN: Ceruloplasmin: 29 mg/dL (ref 18–36)

## 2019-12-11 ENCOUNTER — Ambulatory Visit (INDEPENDENT_AMBULATORY_CARE_PROVIDER_SITE_OTHER): Payer: BLUE CROSS/BLUE SHIELD | Admitting: Family Medicine

## 2019-12-11 ENCOUNTER — Other Ambulatory Visit: Payer: Self-pay

## 2019-12-11 ENCOUNTER — Encounter: Payer: Self-pay | Admitting: Family Medicine

## 2019-12-11 VITALS — BP 148/79 | HR 90 | Ht 72.0 in | Wt 208.0 lb

## 2019-12-11 DIAGNOSIS — F411 Generalized anxiety disorder: Secondary | ICD-10-CM | POA: Diagnosis not present

## 2019-12-11 DIAGNOSIS — R251 Tremor, unspecified: Secondary | ICD-10-CM

## 2019-12-11 DIAGNOSIS — I10 Essential (primary) hypertension: Secondary | ICD-10-CM | POA: Diagnosis not present

## 2019-12-11 MED ORDER — FLUOXETINE HCL 20 MG PO CAPS: 20.0000 mg | ORAL_CAPSULE | Freq: Every day | ORAL | 3 refills | Status: DC

## 2019-12-11 NOTE — Assessment & Plan Note (Signed)
Still think his tremor is a more high-frequency tremor consistent with essential tremor but I would like to refer him to neurology since it really started after having had Covid and I have had a handful of patients develop tremor after having had Covid in 1 to make sure that we are treating him appropriately.  Did discuss with him that benzodiazepines and things like alcohol do help but that does not mean that it is the mainstay treatment to use on a regular basis so really 1 to make sure that we are getting him on appropriate therapy and not overusing other medications.

## 2019-12-11 NOTE — Assessment & Plan Note (Signed)
Pressure was up today but it looked fantastic when he was here about 2 weeks ago.  He is can come back in about 2 weeks to see if it seems to be a little bit better regulated that day.
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
## 2019-12-11 NOTE — Assessment & Plan Note (Signed)
Continue with fluoxetine for now.  I would like to circle back around to this possibly at the next visit to discuss either going back up on the dose or either switching to a different SSRI.

## 2019-12-26 ENCOUNTER — Ambulatory Visit: Payer: BLUE CROSS/BLUE SHIELD | Admitting: Family Medicine

## 2020-01-05 DIAGNOSIS — Z8616 Personal history of COVID-19: Secondary | ICD-10-CM | POA: Diagnosis not present

## 2020-01-05 DIAGNOSIS — R251 Tremor, unspecified: Secondary | ICD-10-CM | POA: Diagnosis not present

## 2020-01-10 ENCOUNTER — Other Ambulatory Visit: Payer: Self-pay

## 2020-01-10 ENCOUNTER — Encounter: Payer: Self-pay | Admitting: Family Medicine

## 2020-01-10 ENCOUNTER — Telehealth (INDEPENDENT_AMBULATORY_CARE_PROVIDER_SITE_OTHER): Payer: BLUE CROSS/BLUE SHIELD | Admitting: Family Medicine

## 2020-01-10 VITALS — BP 122/92 | HR 74

## 2020-01-10 DIAGNOSIS — G250 Essential tremor: Secondary | ICD-10-CM

## 2020-01-10 DIAGNOSIS — F411 Generalized anxiety disorder: Secondary | ICD-10-CM

## 2020-01-10 DIAGNOSIS — R202 Paresthesia of skin: Secondary | ICD-10-CM | POA: Diagnosis not present

## 2020-01-10 NOTE — Assessment & Plan Note (Signed)
Notices numb spot on bottom of foot. Consider Neuroma? Monitor for change or worsening of sxs.  F/U in person at next OV> he wears good supportive shoe-wear and avoids going barefoot.

## 2020-01-10 NOTE — Progress Notes (Signed)
Spoke w/pt and he stated that he is doing some better. He feels that he doesn't notice the tremors if he isn't paying attention, and they come and go.  He doesn't really take the klonopin unless he has to and he really tries to avoid taking this unless his anxiety gets really bad.   He was seen by Dr. Lynnette Caffey on 01/05/20  And was advised to f/u if his sxs get worse.

## 2020-01-10 NOTE — Progress Notes (Signed)
Virtual Visit via Video Note  I connected with Clinton Cox on 01/10/20 at  9:50 AM EDT by a video enabled telemedicine application and verified that I am speaking with the correct person using two identifiers.   I discussed the limitations of evaluation and management by telemedicine and the availability of in person appointments. The patient expressed understanding and agreed to proceed.  Patient location: at  Home Provider location: in office  Subjective:    CC: F/U anxiety, tremor  HPI:  Spoke w/pt and he stated that he is doing some better. He feels that he doesn't notice the tremors if he isn't paying attention, and they come and go.   F/U Anxiety - He doesn't really take the klonopin unless he has to and he really tries to avoid taking this unless his anxiety gets really bad.   F/U tremor.  He was seen by Dr. Lynnette Caffey on 01/05/20  and was advised to f/u if his sxs get worse. Dr. Lynnette Caffey confirmed the dx of essential tremor.  Recommend trial of BBlocker if pt desired. Tremor is some better.  Notices improves when he drinks alcohol. Has a drink around 5PM each day but denies drinking to excess.     Past medical history, Surgical history, Family history not pertinant except as noted below, Social history, Allergies, and medications have been entered into the medical record, reviewed, and corrections made.   Review of Systems: No fevers, chills, night sweats, weight loss, chest pain, or shortness of breath.   Objective:    General: Speaking clearly in complete sentences without any shortness of breath.  Alert and oriented x3.  Normal judgment. No apparent acute distress.    Impression and Recommendations:    GAD (generalized anxiety disorder) Continue to Prozac daily and xanax prn only.  Reminded him to use sparingly.  I am concerned about potential for dependency. He has been trying not to use daily and feels his anxiety is a little better.   Paresthesias Notices numb spot  on bottom of foot. Consider Neuroma? Monitor for change or worsening of sxs.  F/U in person at next OV> he wears good supportive shoe-wear and avoids going barefoot.   Essential tremor Dx confirmed by Neurology. Discussed treatment options and expectations. Will hold off on starting meds at this time.       Time spent in encounter 22 minutes  I discussed the assessment and treatment plan with the patient. The patient was provided an opportunity to ask questions and all were answered. The patient agreed with the plan and demonstrated an understanding of the instructions.   The patient was advised to call back or seek an in-person evaluation if the symptoms worsen or if the condition fails to improve as anticipated.   Beatrice Lecher, MD

## 2020-01-10 NOTE — Assessment & Plan Note (Addendum)
Continue to Prozac daily and xanax prn only.  Reminded him to use sparingly.  I am concerned about potential for dependency. He has been trying not to use daily and feels his anxiety is a little better.

## 2020-01-10 NOTE — Assessment & Plan Note (Signed)
Dx confirmed by Neurology. Discussed treatment options and expectations. Will hold off on starting meds at this time.

## 2020-01-12 MED ORDER — CLONAZEPAM 0.5 MG PO TABS: 0.5000 mg | ORAL_TABLET | Freq: Every day | ORAL | 0 refills | Status: DC | PRN

## 2020-03-30 ENCOUNTER — Other Ambulatory Visit: Payer: Self-pay | Admitting: Family Medicine

## 2020-04-02 ENCOUNTER — Other Ambulatory Visit: Payer: Self-pay | Admitting: Family Medicine

## 2020-05-07 ENCOUNTER — Encounter: Payer: Self-pay | Admitting: Family Medicine

## 2020-05-27 ENCOUNTER — Encounter: Payer: Self-pay | Admitting: Family Medicine

## 2020-06-27 ENCOUNTER — Encounter: Payer: Self-pay | Admitting: Family Medicine

## 2020-06-27 ENCOUNTER — Other Ambulatory Visit: Payer: Self-pay

## 2020-06-27 ENCOUNTER — Ambulatory Visit (INDEPENDENT_AMBULATORY_CARE_PROVIDER_SITE_OTHER): Payer: 59 | Admitting: Family Medicine

## 2020-06-27 VITALS — BP 137/82 | HR 74 | Ht 72.0 in | Wt 212.0 lb

## 2020-06-27 DIAGNOSIS — Z0000 Encounter for general adult medical examination without abnormal findings: Secondary | ICD-10-CM | POA: Diagnosis not present

## 2020-06-27 DIAGNOSIS — U099 Post covid-19 condition, unspecified: Secondary | ICD-10-CM

## 2020-06-27 DIAGNOSIS — R5383 Other fatigue: Secondary | ICD-10-CM

## 2020-06-27 DIAGNOSIS — L989 Disorder of the skin and subcutaneous tissue, unspecified: Secondary | ICD-10-CM | POA: Diagnosis not present

## 2020-06-27 DIAGNOSIS — R600 Localized edema: Secondary | ICD-10-CM

## 2020-06-27 MED ORDER — ACYCLOVIR 200 MG PO CAPS: 400.0000 mg | ORAL_CAPSULE | Freq: Three times a day (TID) | ORAL | 99 refills | Status: DC
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
## 2020-06-28 ENCOUNTER — Encounter: Payer: Self-pay | Admitting: Family Medicine

## 2020-06-28 LAB — MICROALBUMIN / CREATININE URINE RATIO
Creatinine, Urine: 122 mg/dL (ref 20–320)
Microalb Creat Ratio: 4 mcg/mg creat (ref <30)
Microalb, Ur: 0.5 mg/dL

## 2020-06-28 NOTE — Telephone Encounter (Signed)
We didn't do lead test. We can see if can add on.  Yes cholesterol and weight are linked.

## 2020-06-28 NOTE — Progress Notes (Signed)
Hi Clinton Cox, no protein in the urine that is worrisome which is great.  Your metabolic panel looks good.  Liver enzymes are normal.  Your cholesterol is way up compared to previous I think that is probably just because of the decreased activity and weight gain so again just encourage you to work on making healthier food choices and setting some small goals for yourself to work towards.  Prostate test is normal.  Your thyroid looks good.  Vitamin B12 is technically in the normal range but it is definitely very much so on the low end.  I would recommend starting an over-the-counter vitamin B12 supplement and then we can recheck your level in about 8 weeks to make sure that you are absorbing it well.  Iron stores look good.  Vitamin B1 is still pending.

## 2020-07-02 LAB — LIPID PANEL
Cholesterol: 247 mg/dL — ABNORMAL HIGH (ref <200)
HDL: 65 mg/dL (ref >=40)
LDL Cholesterol (Calc): 158 mg/dL (calc) — ABNORMAL HIGH
Non-HDL Cholesterol (Calc): 182 mg/dL (calc) — ABNORMAL HIGH (ref <130)
Total CHOL/HDL Ratio: 3.8 (calc) (ref <5.0)
Triglycerides: 119 mg/dL (ref <150)

## 2020-07-02 LAB — PSA: PSA: 0.72 ng/mL (ref <=4.0)

## 2020-07-02 LAB — COMPLETE METABOLIC PANEL WITH GFR
AG Ratio: 1.7 (calc) (ref 1.0–2.5)
ALT: 30 U/L (ref 9–46)
AST: 20 U/L (ref 10–35)
Albumin: 4.3 g/dL (ref 3.6–5.1)
Alkaline phosphatase (APISO): 62 U/L (ref 35–144)
BUN: 15 mg/dL (ref 7–25)
CO2: 30 mmol/L (ref 20–32)
Calcium: 9.6 mg/dL (ref 8.6–10.3)
Chloride: 101 mmol/L (ref 98–110)
Creat: 0.93 mg/dL (ref 0.70–1.33)
GFR, Est African American: 107 mL/min/{1.73_m2} (ref >=60)
GFR, Est Non African American: 93 mL/min/{1.73_m2} (ref >=60)
Globulin: 2.5 g/dL (calc) (ref 1.9–3.7)
Glucose, Bld: 101 mg/dL — ABNORMAL HIGH (ref 65–99)
Potassium: 4.6 mmol/L (ref 3.5–5.3)
Sodium: 140 mmol/L (ref 135–146)
Total Bilirubin: 0.7 mg/dL (ref 0.2–1.2)
Total Protein: 6.8 g/dL (ref 6.1–8.1)

## 2020-07-02 LAB — FERRITIN: Ferritin: 76 ng/mL (ref 38–380)

## 2020-07-02 LAB — VITAMIN B12: Vitamin B-12: 242 pg/mL (ref 200–1100)

## 2020-07-02 LAB — TSH: TSH: 3.13 mIU/L (ref 0.40–4.50)

## 2020-07-02 LAB — VITAMIN B1: Vitamin B1 (Thiamine): 7 nmol/L — ABNORMAL LOW (ref 8–30)

## 2020-07-03 ENCOUNTER — Other Ambulatory Visit: Payer: Self-pay | Admitting: *Deleted

## 2020-07-03 DIAGNOSIS — E519 Thiamine deficiency, unspecified: Secondary | ICD-10-CM

## 2020-09-29 ENCOUNTER — Other Ambulatory Visit: Payer: Self-pay | Admitting: Family Medicine

## 2020-12-05 ENCOUNTER — Other Ambulatory Visit: Payer: Self-pay

## 2020-12-05 ENCOUNTER — Ambulatory Visit (INDEPENDENT_AMBULATORY_CARE_PROVIDER_SITE_OTHER): Payer: 59 | Admitting: Family Medicine

## 2020-12-05 VITALS — BP 129/75 | HR 91 | Ht 72.0 in | Wt 206.0 lb

## 2020-12-05 DIAGNOSIS — K641 Second degree hemorrhoids: Secondary | ICD-10-CM

## 2020-12-05 DIAGNOSIS — F411 Generalized anxiety disorder: Secondary | ICD-10-CM | POA: Diagnosis not present

## 2020-12-05 DIAGNOSIS — R195 Other fecal abnormalities: Secondary | ICD-10-CM | POA: Diagnosis not present

## 2020-12-05 HISTORY — DX: Second degree hemorrhoids: K64.1

## 2020-12-05 MED ORDER — HYDROCORTISONE 2.5 % EX CREA: TOPICAL_CREAM | Freq: Every evening | CUTANEOUS | 0 refills | Status: DC | PRN

## 2020-12-05 MED ORDER — HYDROCORTISONE ACETATE 25 MG RE SUPP: 25.0000 mg | Freq: Two times a day (BID) | RECTAL | 0 refills | Status: DC

## 2020-12-05 MED ORDER — CLONAZEPAM 0.5 MG PO TABS: 0.5000 mg | ORAL_TABLET | Freq: Every day | ORAL | 0 refills | Status: DC | PRN

## 2020-12-05 NOTE — Assessment & Plan Note (Signed)
We discussed continuing to work on making sure that his bowels are moving well and not having to strain.  Continue with the MiraLAX daily since that does seem to be helping him to give or give him a little bit stronger prescription steroid he can also use over-the-counter lidocaine gel if needed continue with the hot bath soaks since that actually does seem to help with the itching.  If not better in 1 week then please let us know.

## 2020-12-05 NOTE — Assessment & Plan Note (Signed)
Refilled clonazepam today and just reminded him to use sparingly.
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
## 2021-01-30 ENCOUNTER — Telehealth: Payer: Self-pay | Admitting: Family Medicine

## 2021-01-30 ENCOUNTER — Other Ambulatory Visit: Payer: Self-pay

## 2021-01-30 ENCOUNTER — Encounter: Payer: Self-pay | Admitting: Family Medicine

## 2021-01-30 ENCOUNTER — Ambulatory Visit (INDEPENDENT_AMBULATORY_CARE_PROVIDER_SITE_OTHER): Payer: 59 | Admitting: Family Medicine

## 2021-01-30 VITALS — BP 137/89 | HR 100 | Resp 17 | Wt 198.6 lb

## 2021-01-30 DIAGNOSIS — Z0000 Encounter for general adult medical examination without abnormal findings: Secondary | ICD-10-CM

## 2021-01-30 DIAGNOSIS — R9431 Abnormal electrocardiogram [ECG] [EKG]: Secondary | ICD-10-CM

## 2021-01-30 DIAGNOSIS — R195 Other fecal abnormalities: Secondary | ICD-10-CM | POA: Diagnosis not present

## 2021-01-30 DIAGNOSIS — R5383 Other fatigue: Secondary | ICD-10-CM

## 2021-01-30 DIAGNOSIS — R0600 Dyspnea, unspecified: Secondary | ICD-10-CM

## 2021-01-30 DIAGNOSIS — R82998 Other abnormal findings in urine: Secondary | ICD-10-CM

## 2021-01-30 DIAGNOSIS — K641 Second degree hemorrhoids: Secondary | ICD-10-CM | POA: Diagnosis not present

## 2021-01-30 DIAGNOSIS — R0609 Other forms of dyspnea: Secondary | ICD-10-CM

## 2021-01-30 DIAGNOSIS — Z131 Encounter for screening for diabetes mellitus: Secondary | ICD-10-CM | POA: Diagnosis not present

## 2021-01-30 NOTE — Telephone Encounter (Signed)
I was supposed to do a urine sample. But somehow it just never happened. And I totally forgot that part. Can I come back up there today and just do that? Hate to not do that part just because we forgot.

## 2021-01-30 NOTE — Telephone Encounter (Signed)
Sure, I will place the order. He can come to our lab or downstairs lab today or tomorrow at his convenience

## 2021-01-30 NOTE — Patient Instructions (Signed)
Take pictures of any finger/toe discolorations Will work up labs for fatigue and poor stamina - we will let you know results  [facility] How can I fix diastasis recti? To fix diastasis recti, you'll need to perform gentle movements that engage the abdominal muscles. Before starting an exercise program, be sure it's safe for diastasis recti. Work with a Customer service manager or physical therapist who has experience with diastasis recti. They can create a treatment plan to make sure you are performing the movements correctly and progressing to more challenging movements at the right time.  Certain movements will make abdominal separation worse. During the postpartum period, there are some modifications you should make:  Avoid lifting anything heavier than your baby. Roll onto your side when getting out of bed or sitting up. Use your arms to push yourself up. Skip activities and movements that push your abdominals outward (like crunches and sit-ups). Some people use binding devices (elastic belly bands) to help hold their belly in and support the lower back. Wearing binders can't heal diastasis recti and will not strengthen your core muscles. It can be a good reminder of your diastasis recti and promote good posture.
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
## 2021-01-31 LAB — URINALYSIS
Bilirubin Urine: NEGATIVE
Glucose, UA: NEGATIVE
Hgb urine dipstick: NEGATIVE
Leukocytes,Ua: NEGATIVE
Nitrite: NEGATIVE
Protein, ur: NEGATIVE
Specific Gravity, Urine: 1.02 (ref 1.001–1.035)
pH: 5.5 (ref 5.0–8.0)

## 2021-01-31 LAB — TSH: TSH: 3.44 mIU/L (ref 0.40–4.50)

## 2021-01-31 LAB — HEMOGLOBIN A1C
Hgb A1c MFr Bld: 5 % of total Hgb (ref <5.7)
Mean Plasma Glucose: 97 mg/dL
eAG (mmol/L): 5.4 mmol/L

## 2021-01-31 LAB — CBC
HCT: 49.4 % (ref 38.5–50.0)
Hemoglobin: 16.3 g/dL (ref 13.2–17.1)
MCH: 31.3 pg (ref 27.0–33.0)
MCHC: 33 g/dL (ref 32.0–36.0)
MCV: 94.8 fL (ref 80.0–100.0)
MPV: 9.4 fL (ref 7.5–12.5)
Platelets: 307 10*3/uL (ref 140–400)
RBC: 5.21 10*6/uL (ref 4.20–5.80)
RDW: 11.8 % (ref 11.0–15.0)
WBC: 6.6 10*3/uL (ref 3.8–10.8)

## 2021-01-31 LAB — VITAMIN B12: Vitamin B-12: 392 pg/mL (ref 200–1100)

## 2021-01-31 LAB — COMPREHENSIVE METABOLIC PANEL
AG Ratio: 1.7 (calc) (ref 1.0–2.5)
ALT: 20 U/L (ref 9–46)
AST: 28 U/L (ref 10–35)
Albumin: 4.6 g/dL (ref 3.6–5.1)
Alkaline phosphatase (APISO): 57 U/L (ref 35–144)
BUN: 14 mg/dL (ref 7–25)
CO2: 24 mmol/L (ref 20–32)
Calcium: 9.3 mg/dL (ref 8.6–10.3)
Chloride: 98 mmol/L (ref 98–110)
Creat: 0.89 mg/dL (ref 0.70–1.30)
Globulin: 2.7 g/dL (calc) (ref 1.9–3.7)
Glucose, Bld: 74 mg/dL (ref 65–99)
Potassium: 4.4 mmol/L (ref 3.5–5.3)
Sodium: 138 mmol/L (ref 135–146)
Total Bilirubin: 0.8 mg/dL (ref 0.2–1.2)
Total Protein: 7.3 g/dL (ref 6.1–8.1)

## 2021-01-31 LAB — SPECIMEN COMPROMISED

## 2021-01-31 NOTE — Telephone Encounter (Signed)
Patient aware.

## 2021-01-31 NOTE — Progress Notes (Signed)
MyChart message sent: Urine showed some ketones but this might be related to your recent diet change/cleanse. We can recheck at your next appointment - make sure you are staying hydrated and eat a well balanced diet. B12 is normal A1c (diabetes test) is normal Blood count is normal Kidney, liver, and electrolytes are normal Thyroid is normal

## 2021-02-06 ENCOUNTER — Encounter: Payer: Self-pay | Admitting: Gastroenterology

## 2021-02-17 ENCOUNTER — Ambulatory Visit (HOSPITAL_BASED_OUTPATIENT_CLINIC_OR_DEPARTMENT_OTHER): Payer: 59

## 2021-02-25 ENCOUNTER — Other Ambulatory Visit: Payer: Self-pay

## 2021-02-25 ENCOUNTER — Encounter: Payer: Self-pay | Admitting: Gastroenterology

## 2021-02-25 ENCOUNTER — Ambulatory Visit (INDEPENDENT_AMBULATORY_CARE_PROVIDER_SITE_OTHER): Payer: 59 | Admitting: Gastroenterology

## 2021-02-25 VITALS — BP 142/88 | HR 90 | Ht 72.0 in | Wt 202.0 lb

## 2021-02-25 DIAGNOSIS — K5900 Constipation, unspecified: Secondary | ICD-10-CM | POA: Diagnosis not present

## 2021-02-25 DIAGNOSIS — R198 Other specified symptoms and signs involving the digestive system and abdomen: Secondary | ICD-10-CM

## 2021-02-25 DIAGNOSIS — K921 Melena: Secondary | ICD-10-CM

## 2021-02-25 DIAGNOSIS — R1013 Epigastric pain: Secondary | ICD-10-CM | POA: Diagnosis not present

## 2021-02-25 DIAGNOSIS — K5730 Diverticulosis of large intestine without perforation or abscess without bleeding: Secondary | ICD-10-CM

## 2021-02-25 MED ORDER — CLENPIQ 10-3.5-12 MG-GM -GM/160ML PO SOLN: 1.0000 | ORAL | 0 refills | Status: DC
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
## 2021-02-25 NOTE — Patient Instructions (Signed)
If you are age 55 or older, your body mass index should be between 23-30. Your Body mass index is 27.4 kg/m. If this is out of the aforementioned range listed, please consider follow up with your Primary Care Provider.  If you are age 75 or younger, your body mass index should be between 19-25. Your Body mass index is 27.4 kg/m. If this is out of the aformentioned range listed, please consider follow up with your Primary Care Provider.   __________________________________________________________  The [place] [facility] providers would like to encourage you to use [facility] to communicate with providers for non-urgent requests or questions.  Due to long hold times on the telephone, sending your provider a message by [facility] may be a faster and more efficient way to get a response.  Please allow 48 business hours for a response.  Please remember that this is for non-urgent requests.   We have sent the following medications to your pharmacy for you to pick up at your convenience:  Clenpiq for your colonoscopy  Due to recent changes in healthcare laws, you may see the results of your imaging and laboratory studies on MyChart before your provider has had a chance to review them.  We understand that in some cases there may be results that are confusing or concerning to you. Not all laboratory results come back in the same time frame and the provider may be waiting for multiple results in order to interpret others.  Please give [place] 48 hours in order for your provider to thoroughly review all the results before contacting the office for clarification of your results.   Thank you for choosing me and [facility].  Vito Cirigliano, D.O.

## 2021-02-26 ENCOUNTER — Other Ambulatory Visit: Payer: Self-pay

## 2021-02-26 ENCOUNTER — Ambulatory Visit (HOSPITAL_BASED_OUTPATIENT_CLINIC_OR_DEPARTMENT_OTHER)
Admission: RE | Admit: 2021-02-26 | Discharge: 2021-02-26 | Disposition: A | Discharge status: Home / Self Care | Payer: 59 | Source: Ambulatory Visit | Attending: Family Medicine | Admitting: Family Medicine

## 2021-02-26 DIAGNOSIS — R0609 Other forms of dyspnea: Secondary | ICD-10-CM | POA: Diagnosis not present

## 2021-02-26 DIAGNOSIS — R5383 Other fatigue: Secondary | ICD-10-CM | POA: Diagnosis not present

## 2021-02-26 DIAGNOSIS — R9431 Abnormal electrocardiogram [ECG] [EKG]: Secondary | ICD-10-CM | POA: Insufficient documentation

## 2021-02-26 LAB — ECHOCARDIOGRAM COMPLETE
AR max vel: 2.96 cm2
AV Area VTI: 3.04 cm2
AV Area mean vel: 2.71 cm2
AV Mean grad: 5 mmHg
AV Peak grad: 10 mmHg
Ao pk vel: 1.58 m/s
Area-P 1/2: 3.93 cm2
Calc EF: 66.1 %
S' Lateral: 2.7 cm
Single Plane A2C EF: 67.6 %
Single Plane A4C EF: 63.9 %

## 2021-03-01 ENCOUNTER — Encounter: Payer: Self-pay | Admitting: Family Medicine

## 2021-03-04 NOTE — Progress Notes (Signed)
Hi Clinton Cox, echocardiogram of your heart shows good pumping function.  The EF is 65 to 70% which is normal.  There are no abnormalities in the motion of the wall which is good that means it has not been damaged or hurt.  You do have what is considered grade 1 diastolic dysfunction.  That is a slight abnormality in the relaxation portion of the heart.  This is actually pretty common and is not impacting blood flow through the heart and in a significant way and it is not causing any symptoms.  We will plan to repeat your echo in about 5 years to keep an eye on that.  Overall the valves look good.  No worrisome or concerning findings.    Please let us know if you are planning on getting her flu shot this year.

## 2021-03-05 ENCOUNTER — Telehealth: Payer: Self-pay | Admitting: Gastroenterology

## 2021-03-05 NOTE — Telephone Encounter (Signed)
Thank you for the update!

## 2021-03-05 NOTE — Telephone Encounter (Signed)
Hey Dr. Bryan Lemma,   Inbound call from patient to cancel procedure 10/21 due to medical emergency with his balance and have to be seen. Patient rescheduled for 12/7.  Thank you

## 2021-03-07 ENCOUNTER — Encounter: Payer: 59 | Admitting: Gastroenterology

## 2021-03-07 DIAGNOSIS — H8113 Benign paroxysmal vertigo, bilateral: Secondary | ICD-10-CM

## 2021-03-07 MED ORDER — MECLIZINE HCL 25 MG PO TABS: 25.0000 mg | ORAL_TABLET | Freq: Three times a day (TID) | ORAL | 0 refills | Status: DC | PRN

## 2021-03-11 ENCOUNTER — Encounter: Payer: Self-pay | Admitting: Sports Medicine

## 2021-03-11 ENCOUNTER — Ambulatory Visit (INDEPENDENT_AMBULATORY_CARE_PROVIDER_SITE_OTHER): Payer: 59 | Admitting: Sports Medicine

## 2021-03-11 DIAGNOSIS — R42 Dizziness and giddiness: Secondary | ICD-10-CM

## 2021-03-11 MED ORDER — DIAZEPAM 5 MG PO TABS: 5.0000 mg | ORAL_TABLET | Freq: Three times a day (TID) | ORAL | 0 refills | Status: DC | PRN

## 2021-03-11 MED ORDER — PREDNISONE 50 MG PO TABS: 50.0000 mg | ORAL_TABLET | Freq: Every day | ORAL | 0 refills | Status: DC

## 2021-03-11 NOTE — Progress Notes (Signed)
    Procedures performed today:    None.  Independent interpretation of notes and tests performed by another provider:   None.  Brief History, Exam, Impression, and Recommendations:    Paroxysmal vertigo This is a pleasant 55 year old male, for 3 weeks now he has had an odd sensation consisting of numbness across his forehead bilaterally, as well as an odd sensation of being off balance. He tends to see this typically when laying on his right side.  Often improves a little bit when sitting up, he does endorse that he sees objects moving left and right, this certainly sounds like nystagmus. On exam he has a negative Dix-Hallpike test. Ear exam is unremarkable, hearing is grossly normal, cranial nerves II through XII are intact, no dysdiadochokinesis, no finger dysmetria. I do think this is benign paroxysmal positional vertigo, we discussed the pathophysiology and anatomy, we will do 5 days of prednisone, Valium as a vestibular suppressant as meclizine is insufficiently efficacious, I would also like him to do some vestibular physical therapy. Considering duration of symptoms, recurrence, as well as numbness across his face we are going to go ahead and get the brain MRI. Return to see either myself or PCP in 4 to 6 weeks.    ___________________________________________ Gwen Her. Dianah Field, M.D., ABFM., CAQSM. Primary Care and [place] [employer] of [place] [employer] [facility] [employer]

## 2021-03-11 NOTE — Assessment & Plan Note (Signed)
This is a pleasant 55 year old male, for 3 weeks now he has had an odd sensation consisting of numbness across his forehead bilaterally, as well as an odd sensation of being off balance. He tends to see this typically when laying on his right side.  Often improves a little bit when sitting up, he does endorse that he sees objects moving left and right, this certainly sounds like nystagmus. On exam he has a negative Dix-Hallpike test. Ear exam is unremarkable, hearing is grossly normal, cranial nerves II through XII are intact, no dysdiadochokinesis, no finger dysmetria. I do think this is benign paroxysmal positional vertigo, we discussed the pathophysiology and anatomy, we will do 5 days of prednisone, Valium as a vestibular suppressant as meclizine is insufficiently efficacious, I would also like him to do some vestibular physical therapy. Considering duration of symptoms, recurrence, as well as numbness across his face we are going to go ahead and get the brain MRI. Return to see either myself or PCP in 4 to 6 weeks.

## 2021-03-19 ENCOUNTER — Ambulatory Visit (INDEPENDENT_AMBULATORY_CARE_PROVIDER_SITE_OTHER): Payer: 59 | Admitting: Physical Therapy

## 2021-03-19 ENCOUNTER — Encounter: Payer: Self-pay | Admitting: Physical Therapy

## 2021-03-19 ENCOUNTER — Other Ambulatory Visit: Payer: Self-pay

## 2021-03-19 DIAGNOSIS — R42 Dizziness and giddiness: Secondary | ICD-10-CM

## 2021-03-19 NOTE — Patient Instructions (Signed)
Access Code: [email or web address] URL: [email or web address] Date: 03/19/2021 Prepared by: Isabelle Course  Exercises Walking Gaze Stabilization Head Nod - 1 x daily - 7 x weekly - 3 sets - 10 reps Walking Gaze Stabilization Head Rotation - 1 x daily - 7 x weekly - 3 sets - 10 reps Walk Turns - 1 x daily - 7 x weekly - 3 sets - 10 reps Backward Walking Gaze Stabilization with Head Nods (VOR I) - 1 x daily - 7 x weekly - 3 sets - 10 reps Backward Walking Gaze Stabilization with Head Rotation (VOR I) - 1 x daily - 7 x weekly - 3 sets - 10 reps Walking Gaze Stabilization Head Rotation with Horizontal Arm Movement - 1 x daily - 7 x weekly - 3 sets - 10 reps Walking Gaze Stabilization Head Nod with Opposite Vertical Arm Movement - 1 x daily - 7 x weekly - 3 sets - 10 reps
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
## 2021-04-06 ENCOUNTER — Other Ambulatory Visit: Payer: Self-pay | Admitting: Family Medicine

## 2021-04-23 ENCOUNTER — Encounter: Payer: Self-pay | Admitting: Gastroenterology

## 2021-04-23 ENCOUNTER — Ambulatory Visit (AMBULATORY_SURGERY_CENTER): Payer: 59 | Admitting: Gastroenterology

## 2021-04-23 ENCOUNTER — Other Ambulatory Visit: Payer: Self-pay

## 2021-04-23 VITALS — BP 131/68 | HR 85 | Temp 98.2°F | Resp 13 | Ht 72.0 in | Wt 202.0 lb

## 2021-04-23 DIAGNOSIS — K5730 Diverticulosis of large intestine without perforation or abscess without bleeding: Secondary | ICD-10-CM

## 2021-04-23 DIAGNOSIS — K641 Second degree hemorrhoids: Secondary | ICD-10-CM | POA: Diagnosis not present

## 2021-04-23 DIAGNOSIS — K921 Melena: Secondary | ICD-10-CM

## 2021-04-23 DIAGNOSIS — K2990 Gastroduodenitis, unspecified, without bleeding: Secondary | ICD-10-CM | POA: Diagnosis not present

## 2021-04-23 DIAGNOSIS — R1013 Epigastric pain: Secondary | ICD-10-CM | POA: Diagnosis not present

## 2021-04-23 DIAGNOSIS — K635 Polyp of colon: Secondary | ICD-10-CM

## 2021-04-23 DIAGNOSIS — K319 Disease of stomach and duodenum, unspecified: Secondary | ICD-10-CM | POA: Diagnosis not present

## 2021-04-23 DIAGNOSIS — K5900 Constipation, unspecified: Secondary | ICD-10-CM

## 2021-04-23 DIAGNOSIS — K621 Rectal polyp: Secondary | ICD-10-CM | POA: Diagnosis not present

## 2021-04-23 DIAGNOSIS — K2970 Gastritis, unspecified, without bleeding: Secondary | ICD-10-CM

## 2021-04-23 DIAGNOSIS — D125 Benign neoplasm of sigmoid colon: Secondary | ICD-10-CM

## 2021-04-23 DIAGNOSIS — R198 Other specified symptoms and signs involving the digestive system and abdomen: Secondary | ICD-10-CM | POA: Diagnosis not present

## 2021-04-23 DIAGNOSIS — D122 Benign neoplasm of ascending colon: Secondary | ICD-10-CM

## 2021-04-23 DIAGNOSIS — D128 Benign neoplasm of rectum: Secondary | ICD-10-CM

## 2021-04-23 MED ORDER — SODIUM CHLORIDE 0.9 % IV SOLN: 500.0000 mL | Freq: Once | INTRAVENOUS | Status: DC

## 2021-04-23 MED ORDER — PANTOPRAZOLE SODIUM 40 MG PO TBEC: 40.0000 mg | DELAYED_RELEASE_TABLET | Freq: Two times a day (BID) | ORAL | 3 refills | Status: DC

## 2021-04-23 NOTE — Progress Notes (Signed)
Called to room to assist during endoscopic procedure.  Patient ID and intended procedure confirmed with present staff. Received instructions for my participation in the procedure from the performing physician.  
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
## 2021-04-23 NOTE — Progress Notes (Signed)
No problems noted in the recovery room. maw 

## 2021-04-23 NOTE — Op Note (Signed)
[place] Patient Name: Clinton Cox Procedure Date: 04/23/2021 12:23 PM MRN: [id number] Endoscopist: Gerrit Heck , MD Age: 55 Referring MD:  Date of Birth: [birth year] Gender: Male Account #: [id number] Procedure:                Colonoscopy Indications:              Hematochezia, Change in bowel habits, Change in                            stool caliber, Constipation, Rectal                            discomfort/fullness Medicines:                Monitored Anesthesia Care Procedure:                Pre-Anesthesia Assessment:                           - Prior to the procedure, a History and Physical                            was performed, and patient medications and                            allergies were reviewed. The patient's tolerance of                            previous anesthesia was also reviewed. The risks                            and benefits of the procedure and the sedation                            options and risks were discussed with the patient.                            All questions were answered, and informed consent                            was obtained. Prior Anticoagulants: The patient has                            taken no previous anticoagulant or antiplatelet                            agents. ASA Grade Assessment: II - A patient with                            mild systemic disease. After reviewing the risks                            and benefits, the patient was deemed in  satisfactory condition to undergo the procedure.                           After obtaining informed consent, the colonoscope                            was passed under direct vision. Throughout the                            procedure, the patient's blood pressure, pulse, and                            oxygen saturations were monitored continuously. The                            CF [id number] #[id number] was introduced through the anus                             and advanced to the the terminal ileum. The                            colonoscopy was performed without difficulty. The                            patient tolerated the procedure well. The quality                            of the bowel preparation was good. The terminal                            ileum, ileocecal valve, appendiceal orifice, and                            rectum were photographed. Scope In: 1:06:01 PM Scope Out: 1:27:46 PM Scope Withdrawal Time: 0 hours 18 minutes 16 seconds  Total Procedure Duration: 0 hours 21 minutes 45 seconds  Findings:                 The perianal and digital rectal examinations were                            normal.                           A 3 mm polyp was found in the ascending colon. The                            polyp was sessile. The polyp was removed with a                            cold snare. Resection and retrieval were complete.                            Estimated blood loss was minimal.  A 3 mm polyp was found in the sigmoid colon. The                            polyp was sessile. The polyp was removed with a                            cold snare. Resection and retrieval were complete.                            Estimated blood loss was minimal.                           Three sessile polyps were found in the rectum. The                            polyps were 2 to 3 mm in size. These polyps were                            removed with a cold snare. Resection and retrieval                            were complete. Estimated blood loss was minimal.                           Multiple small and large-mouthed diverticula were                            found in the sigmoid colon.                           Non-bleeding internal hemorrhoids were found during                            retroflexion. The hemorrhoids were small and Grade                            II (internal hemorrhoids that  prolapse but reduce                            spontaneously). The rectum was otherwise normal                            appearing. The mucosa was normal. and no erythema,                            edema, erosions, ulceration, and no luminal                            abnormalities.                           The terminal ileum appeared normal. Complications:            No immediate complications. Estimated Blood Loss:  Estimated blood loss was minimal. Impression:               - One 3 mm polyp in the ascending colon, removed                            with a cold snare. Resected and retrieved.                           - One 3 mm polyp in the sigmoid colon, removed with                            a cold snare. Resected and retrieved.                           - Three 2 to 3 mm polyps in the rectum, removed                            with a cold snare. Resected and retrieved.                           - Diverticulosis in the sigmoid colon.                           - Non-bleeding internal hemorrhoids.                           - The examined portion of the ileum was normal. Recommendation:           - Patient has a contact number available for                            emergencies. The signs and symptoms of potential                            delayed complications were discussed with the                            patient. Return to normal activities tomorrow.                            Written discharge instructions were provided to the                            patient.                           - Resume previous diet.                           - Continue present medications.                           - Await pathology results.                           -  Repeat colonoscopy for surveillance based on                            pathology results.                           - Return to GI office PRN.                           - Use fiber, for example Citrucel, Fibercon, Konsyl                             or Metamucil.                           - Internal hemorrhoids were noted on this study and                            may be amenable to hemorrhoid band ligation. If you                            are interested in further treatment of these                            hemorrhoids with band ligation, please contact my                            clinic to set up an appointment for evaluation and                            treatment. Gerrit Heck, MD 04/23/2021 1:38:45 PM

## 2021-04-23 NOTE — Op Note (Signed)
[place] Patient Name: Clinton Cox Procedure Date: 04/23/2021 12:25 PM MRN: [id number] Endoscopist: Gerrit Heck , MD Age: 55 Referring MD:  Date of Birth: [birth year] Gender: Male Account #: [id number] Procedure:                Upper GI endoscopy Indications:              Epigastric abdominal pain, Abdominal pain in the                            left upper quadrant Medicines:                Monitored Anesthesia Care Procedure:                Pre-Anesthesia Assessment:                           - Prior to the procedure, a History and Physical                            was performed, and patient medications and                            allergies were reviewed. The patient's tolerance of                            previous anesthesia was also reviewed. The risks                            and benefits of the procedure and the sedation                            options and risks were discussed with the patient.                            All questions were answered, and informed consent                            was obtained. Prior Anticoagulants: The patient has                            taken no previous anticoagulant or antiplatelet                            agents. ASA Grade Assessment: II - A patient with                            mild systemic disease. After reviewing the risks                            and benefits, the patient was deemed in                            satisfactory condition to undergo the procedure.  After obtaining informed consent, the endoscope was                            passed under direct vision. Throughout the                            procedure, the patient's blood pressure, pulse, and                            oxygen saturations were monitored continuously. The                            GIF [id number] #[id number] was introduced through the                            mouth, and advanced to the second part  of duodenum.                            The upper GI endoscopy was accomplished without                            difficulty. The patient tolerated the procedure                            well. Scope In: Scope Out: Findings:                 The examined esophagus was normal.                           Localized moderate inflammation characterized by                            congestion (edema) and erythema was found at the                            incisura and in the gastric antrum. Biopsies were                            taken with a cold forceps for histology. Estimated                            blood loss was minimal.                           The gastric fundus and gastric body were normal.                            Additional mucosal biopsies were taken with a cold                            forceps for Helicobacter pylori testing. Estimated                            blood loss was minimal.  The examined duodenum was normal. Biopsies were                            taken with a cold forceps for histology. Estimated                            blood loss was minimal. Complications:            No immediate complications. Estimated Blood Loss:     Estimated blood loss was minimal. Impression:               - Normal esophagus.                           - Gastritis. Biopsied.                           - Normal gastric fundus and gastric body. Biopsied.                           - Normal examined duodenum. Biopsied. Recommendation:           - Patient has a contact number available for                            emergencies. The signs and symptoms of potential                            delayed complications were discussed with the                            patient. Return to normal activities tomorrow.                            Written discharge instructions were provided to the                            patient.                           - Resume  previous diet.                           - Continue present medications.                           - Await pathology results.                           - Use Protonix (pantoprazole) 40 mg PO BID for 6                            weeks to promote gastric mucosal healing, then                            reduce to 40 mg daily, and can titrate off if no  further need for acid suppression therapy.                           - Perform a colonoscopy today.                           - Return to GI clinic PRN. Gerrit Heck, MD 04/23/2021 1:34:10 PM

## 2021-04-23 NOTE — Progress Notes (Signed)
Sedate, gd SR, tolerated procedure well, VSS, report to RN 

## 2021-04-23 NOTE — Progress Notes (Signed)
Vital signs checked by:DT  The medical and surgical history was reviewed and verified with the patient.  

## 2021-04-23 NOTE — Patient Instructions (Addendum)
Handouts were given to your care partner on gastritis, polyps, diverticulosis, and hemorrhoids. Add PANTOPRAZOLE 40 mg 2 x per day for 6 weeks.  It is best to take 10-20 minutes before food.  Then you can reduce to taking daily, and can titrate off if no further need for acid suppression therapy. You may resume your current medications today. Await biopsy results.  May take 1-3 weeks to receive pathology results. Please call if any questions or concerns.       YOU HAD AN ENDOSCOPIC PROCEDURE TODAY AT [facility]:   Refer to the procedure report that was given to you for any specific questions about what was found during the examination.  If the procedure report does not answer your questions, please call your gastroenterologist to clarify.  If you requested that your care partner not be given the details of your procedure findings, then the procedure report has been included in a sealed envelope for you to review at your convenience later.  YOU SHOULD EXPECT: Some feelings of bloating in the abdomen. Passage of more gas than usual.  Walking can help get rid of the air that was put into your GI tract during the procedure and reduce the bloating. If you had a lower endoscopy (such as a colonoscopy or flexible sigmoidoscopy) you may notice spotting of blood in your stool or on the toilet paper. If you underwent a bowel prep for your procedure, you may not have a normal bowel movement for a few days.  Please Note:  You might notice some irritation and congestion in your nose or some drainage.  This is from the oxygen used during your procedure.  There is no need for concern and it should clear up in a day or so.  SYMPTOMS TO REPORT IMMEDIATELY:  Following lower endoscopy (colonoscopy or flexible sigmoidoscopy):  Excessive amounts of blood in the stool  Significant tenderness or worsening of abdominal pains  Swelling of the abdomen that is new, acute  Fever of 100F or  higher  Following upper endoscopy (EGD)  Vomiting of blood or coffee ground material  New chest pain or pain under the shoulder blades  Painful or persistently difficult swallowing  New shortness of breath  Fever of 100F or higher  Black, tarry-looking stools  For urgent or emergent issues, a gastroenterologist can be reached at any hour by calling [phone]. Do not use MyChart messaging for urgent concerns.    DIET:  We do recommend a small meal at first, but then you may proceed to your regular diet.  Drink plenty of fluids but you should avoid alcoholic beverages for 24 hours.  ACTIVITY:  You should plan to take it easy for the rest of today and you should NOT DRIVE or use heavy machinery until tomorrow (because of the sedation medicines used during the test).    FOLLOW UP: Our staff will call the number listed on your records 48-72 hours following your procedure to check on you and address any questions or concerns that you may have regarding the information given to you following your procedure. If we do not reach you, we will leave a message.  We will attempt to reach you two times.  During this call, we will ask if you have developed any symptoms of COVID 19. If you develop any symptoms (ie: fever, flu-like symptoms, shortness of breath, cough etc.) before then, please call [phone].  If you test positive for Covid 19 in the 2 weeks  post procedure, please call and report this information to [place].    If any biopsies were taken you will be contacted by phone or by letter within the next 1-3 weeks.  Please call us at [phone] if you have not heard about the biopsies in 3 weeks.    SIGNATURES/CONFIDENTIALITY: You and/or your care partner have signed paperwork which will be entered into your electronic medical record.  These signatures attest to the fact that that the information above on your After Visit Summary has been reviewed and is understood.  Full responsibility of  the confidentiality of this discharge information lies with you and/or your care-partner.

## 2021-04-25 ENCOUNTER — Telehealth: Payer: Self-pay | Admitting: *Deleted

## 2021-04-25 NOTE — Telephone Encounter (Signed)
  Follow up Call-  Call back number 04/23/2021  Post procedure Call Back phone  # [phone]  Permission to leave phone message Yes  Some recent data might be hidden     Patient questions: Message left to call us if necessary.

## 2021-04-25 NOTE — Telephone Encounter (Signed)
  Follow up Call-  Call back number 04/23/2021  Post procedure Call Back phone  # [phone]  Permission to leave phone message Yes  Some recent data might be hidden     Patient questions:  Message left to call us if necessary.

## 2021-04-30 ENCOUNTER — Encounter: Payer: Self-pay | Admitting: Gastroenterology

## 2021-06-12 ENCOUNTER — Encounter: Payer: Self-pay | Admitting: Family Medicine

## 2021-07-10 ENCOUNTER — Other Ambulatory Visit: Payer: Self-pay | Admitting: Family Medicine

## 2021-07-19 ENCOUNTER — Other Ambulatory Visit: Payer: Self-pay | Admitting: Gastroenterology

## 2021-07-19 DIAGNOSIS — K2970 Gastritis, unspecified, without bleeding: Secondary | ICD-10-CM

## 2021-07-19 DIAGNOSIS — K2990 Gastroduodenitis, unspecified, without bleeding: Secondary | ICD-10-CM

## 2021-07-19 DIAGNOSIS — R1013 Epigastric pain: Secondary | ICD-10-CM

## 2021-10-10 ENCOUNTER — Other Ambulatory Visit: Payer: Self-pay | Admitting: Family Medicine

## 2021-10-10 NOTE — Telephone Encounter (Signed)
Please call pt to schedule appt.  No further refills until pt is seen.  T. Verda Mehta, CMA  

## 2021-10-10 NOTE — Telephone Encounter (Signed)
Patient has [employer] non participating insurance & he was asking questions about his insurance being out of network. I told him he can not be marked self pay as he is currently verified with [employer] non participating insurance and it is out of network with [place]. I let him know he would have to find an insurance company he is in network with if he doesn't want to pay the high deductibles. Patient said he would check on this and get back with [place]. AMUCK

## 2021-11-05 ENCOUNTER — Other Ambulatory Visit: Payer: Self-pay | Admitting: Family Medicine

## 2021-11-05 NOTE — Telephone Encounter (Signed)
Please call pt and inform him that he will need to do a f/u appointment for refills of his medications. Thanks!!

## 2021-11-05 NOTE — Telephone Encounter (Signed)
Patient has [employer] non participating insurance & he was asking questions about his insurance being out of network. I told him he can not be marked self pay as he is currently verified with [employer] non participating insurance and it is out of network with [place]. He stated that I had just spoke with him about this a month ago, and he feels forced to find another Dr. Since he is with [place] marked self pay, since he is active with an insurance company. AMUCK

## 2021-11-05 NOTE — Telephone Encounter (Signed)
Call pt and have him schedule an appt for refills. Thanks.

## 2021-12-02 ENCOUNTER — Other Ambulatory Visit: Payer: Self-pay | Admitting: Family Medicine

## 2021-12-16 ENCOUNTER — Other Ambulatory Visit: Payer: Self-pay | Admitting: Family Medicine

## 2021-12-16 NOTE — Telephone Encounter (Signed)
LVM for patient explaining information below & to give [place] a call back to scheduled f/u appt with labs, AMUCK.

## 2021-12-16 NOTE — Telephone Encounter (Signed)
Please advise pt that he will need to schedule an appt for medication refills and he will also need labs. We understand that he doesn't have adequate insurance at this time however we cannot continue to write medication refills for him without him doing these steps. Thanks

## 2022-02-02 ENCOUNTER — Encounter: Payer: Self-pay | Admitting: Sports Medicine

## 2022-05-22 DIAGNOSIS — R7989 Other specified abnormal findings of blood chemistry: Secondary | ICD-10-CM | POA: Diagnosis not present

## 2022-06-02 ENCOUNTER — Telehealth (HOSPITAL_COMMUNITY): Payer: Self-pay

## 2022-06-02 ENCOUNTER — Ambulatory Visit (INDEPENDENT_AMBULATORY_CARE_PROVIDER_SITE_OTHER): Payer: 59

## 2022-06-02 ENCOUNTER — Ambulatory Visit: Payer: 59 | Admitting: Sports Medicine

## 2022-06-02 VITALS — BP 124/86 | HR 75 | Wt 195.0 lb

## 2022-06-02 DIAGNOSIS — E559 Vitamin D deficiency, unspecified: Secondary | ICD-10-CM | POA: Diagnosis not present

## 2022-06-02 DIAGNOSIS — M47816 Spondylosis without myelopathy or radiculopathy, lumbar region: Secondary | ICD-10-CM | POA: Diagnosis not present

## 2022-06-02 DIAGNOSIS — Z131 Encounter for screening for diabetes mellitus: Secondary | ICD-10-CM

## 2022-06-02 DIAGNOSIS — R531 Weakness: Secondary | ICD-10-CM

## 2022-06-02 DIAGNOSIS — E538 Deficiency of other specified B group vitamins: Secondary | ICD-10-CM

## 2022-06-02 DIAGNOSIS — M47814 Spondylosis without myelopathy or radiculopathy, thoracic region: Secondary | ICD-10-CM | POA: Diagnosis not present

## 2022-06-02 DIAGNOSIS — R1310 Dysphagia, unspecified: Secondary | ICD-10-CM

## 2022-06-02 DIAGNOSIS — M2550 Pain in unspecified joint: Secondary | ICD-10-CM | POA: Diagnosis not present

## 2022-06-02 DIAGNOSIS — M47812 Spondylosis without myelopathy or radiculopathy, cervical region: Secondary | ICD-10-CM | POA: Diagnosis not present

## 2022-06-02 NOTE — Progress Notes (Addendum)
    Procedures performed today:    None.  Independent interpretation of notes and tests performed by another provider:   None.  Brief History, Exam, Impression, and Recommendations:    Cervical radiculopathy Clinton Cox is a pleasant 57 year old male, he does have underlying anxiety and has been off of his anxiety medicine for some time.  He had a sleep study with Dr. Jerre Cox that showed a REM sleep disturbance, and he is now concerned that he has ALS, Parkinson's. He describes his symptoms as weakness in his legs and difficulty swallowing with occasional choking. His exam is normal, cranial nerves II through XII are intact, motor, sensory, coordination functions are all intact, gait is normal, negative Hoffmann sign.  No tremor. He does have history of cervical herniated disc. I would like to allay his fears, we will going get a swallow study to ensure missing aspiration, x-rays of the cervical, thoracic, and lumbar spine as he is experiencing subjective progressive weakness, we will also add MRI of his cervical, thoracic, and lumbar spine. To allay his fears of ALS he will likely do nerve conduction and EMG, I will arrange a consultation with neurology next-door. Will also go ahead and check some labs. I think he is significantly worried well and I have advised him to get back in with Dr. Linford Cox for further discussion/treatment of his anxiety.  Update: B12 deficiency noted on labs and may be responsible for some of his progressive weakness and vague symptoms, i.e. early subacute combined degeneration. We will start with oral supplementation and recheck in a month, if insufficient improvement then we will assume that he is completely unable to absorb and will need to switch to parenteral supplementation.  Still awaiting imaging.  Update 09/21/2022: for insurance coverage purposes he has cervical radiculopathy with progressive weakness.  Has failed greater than 3 months of conservative  treatment.  B12 deficiency B12 deficiency noted on labs and may be responsible for some of his progressive weakness and vague symptoms, i.e. early subacute combined degeneration. We will start with oral supplementation and recheck in a month, if insufficient improvement then we will assume that he is completely unable to absorb and will need to switch to parenteral supplementation.  I spent 30 minutes of total time managing this patient today, this includes chart review, face to face, and non-face to face time.  ____________________________________________ Clinton Cox. Clinton Cox, M.D., ABFM., CAQSM., AME. Primary Care and Sports Medicine [facility]  Adjunct Professor of Family Medicine  [place] [employer] [employer]  Restaurant manager, fast food

## 2022-06-02 NOTE — Assessment & Plan Note (Addendum)
Clinton Cox is a pleasant 57 year old male, he does have underlying anxiety and has been off of his anxiety medicine for some time.  He had a sleep study with Dr. Jerre Simon that showed a REM sleep disturbance, and he is now concerned that he has ALS, Parkinson's. He describes his symptoms as weakness in his legs and difficulty swallowing with occasional choking. His exam is normal, cranial nerves II through XII are intact, motor, sensory, coordination functions are all intact, gait is normal, negative Hoffmann sign.  No tremor. He does have history of cervical herniated disc. I would like to allay his fears, we will going get a swallow study to ensure missing aspiration, x-rays of the cervical, thoracic, and lumbar spine as he is experiencing subjective progressive weakness, we will also add MRI of his cervical, thoracic, and lumbar spine. To allay his fears of ALS he will likely do nerve conduction and EMG, I will arrange a consultation with neurology next-door. Will also go ahead and check some labs. I think he is significantly worried well and I have advised him to get back in with Dr. Linford Arnold for further discussion/treatment of his anxiety.  Update: B12 deficiency noted on labs and may be responsible for some of his progressive weakness and vague symptoms, i.e. early subacute combined degeneration. We will start with oral supplementation and recheck in a month, if insufficient improvement then we will assume that he is completely unable to absorb and will need to switch to parenteral supplementation.  Still awaiting imaging.  Update 09/21/2022: for insurance coverage purposes he has cervical radiculopathy with progressive weakness.  Has failed greater than 3 months of conservative treatment.

## 2022-06-02 NOTE — Telephone Encounter (Signed)
Attempted to contact patient to schedule Modified Barium Swallow - left voicemail. 

## 2022-06-03 ENCOUNTER — Other Ambulatory Visit: Payer: Self-pay | Admitting: *Deleted

## 2022-06-03 DIAGNOSIS — E538 Deficiency of other specified B group vitamins: Secondary | ICD-10-CM | POA: Insufficient documentation

## 2022-06-03 HISTORY — DX: Deficiency of other specified B group vitamins: E53.8

## 2022-06-03 LAB — LIPID PANEL
Cholesterol: 226 mg/dL — ABNORMAL HIGH (ref <200)
HDL: 82 mg/dL (ref >=40)
LDL Cholesterol (Calc): 126 mg/dL (calc) — ABNORMAL HIGH
Non-HDL Cholesterol (Calc): 144 mg/dL (calc) — ABNORMAL HIGH (ref <130)
Total CHOL/HDL Ratio: 2.8 (calc) (ref <5.0)
Triglycerides: 81 mg/dL (ref <150)

## 2022-06-03 LAB — COMPLETE METABOLIC PANEL WITH GFR
AG Ratio: 1.7 (calc) (ref 1.0–2.5)
ALT: 16 U/L (ref 9–46)
AST: 13 U/L (ref 10–35)
Albumin: 4.4 g/dL (ref 3.6–5.1)
Alkaline phosphatase (APISO): 55 U/L (ref 35–144)
BUN: 13 mg/dL (ref 7–25)
CO2: 30 mmol/L (ref 20–32)
Calcium: 9.1 mg/dL (ref 8.6–10.3)
Chloride: 103 mmol/L (ref 98–110)
Creat: 0.96 mg/dL (ref 0.70–1.30)
Globulin: 2.6 g/dL (calc) (ref 1.9–3.7)
Glucose, Bld: 92 mg/dL (ref 65–99)
Potassium: 4.7 mmol/L (ref 3.5–5.3)
Sodium: 142 mmol/L (ref 135–146)
Total Bilirubin: 0.7 mg/dL (ref 0.2–1.2)
Total Protein: 7 g/dL (ref 6.1–8.1)
eGFR: 93 mL/min/{1.73_m2} (ref >=60)

## 2022-06-03 LAB — CBC
HCT: 46.6 % (ref 38.5–50.0)
Hemoglobin: 16 g/dL (ref 13.2–17.1)
MCH: 32.8 pg (ref 27.0–33.0)
MCHC: 34.3 g/dL (ref 32.0–36.0)
MCV: 95.5 fL (ref 80.0–100.0)
MPV: 8.8 fL (ref 7.5–12.5)
Platelets: 332 10*3/uL (ref 140–400)
RBC: 4.88 10*6/uL (ref 4.20–5.80)
RDW: 12.2 % (ref 11.0–15.0)
WBC: 6.8 10*3/uL (ref 3.8–10.8)

## 2022-06-03 LAB — TSH: TSH: 3.49 mIU/L (ref 0.40–4.50)

## 2022-06-03 LAB — HEMOGLOBIN A1C
Hgb A1c MFr Bld: 5.3 % of total Hgb (ref <5.7)
Mean Plasma Glucose: 105 mg/dL
eAG (mmol/L): 5.8 mmol/L

## 2022-06-03 LAB — CK: Total CK: 35 U/L — ABNORMAL LOW (ref 44–196)

## 2022-06-03 LAB — VITAMIN B12: Vitamin B-12: 185 pg/mL — ABNORMAL LOW (ref 200–1100)

## 2022-06-03 LAB — VITAMIN D 25 HYDROXY (VIT D DEFICIENCY, FRACTURES): Vit D, 25-Hydroxy: 29 ng/mL — ABNORMAL LOW (ref 30–100)

## 2022-06-03 MED ORDER — VITAMIN B-12 1000 MCG PO TABS: 1000.0000 ug | ORAL_TABLET | Freq: Every day | ORAL | 11 refills | Status: DC

## 2022-06-03 MED ORDER — VITAMIN D (ERGOCALCIFEROL) 1.25 MG (50000 UNIT) PO CAPS: 50000.0000 [IU] | ORAL_CAPSULE | ORAL | 0 refills | Status: DC

## 2022-06-03 NOTE — Addendum Note (Signed)
Addended by: Silverio Decamp on: 06/03/2022 10:22 AM   Modules accepted: Orders

## 2022-06-03 NOTE — Assessment & Plan Note (Signed)
B12 deficiency noted on labs and may be responsible for some of his progressive weakness and vague symptoms, i.e. early subacute combined degeneration. We will start with oral supplementation and recheck in a month, if insufficient improvement then we will assume that he is completely unable to absorb and will need to switch to parenteral supplementation.

## 2022-06-09 ENCOUNTER — Telehealth: Payer: Self-pay

## 2022-06-09 ENCOUNTER — Telehealth (HOSPITAL_COMMUNITY): Payer: Self-pay

## 2022-06-09 NOTE — Telephone Encounter (Signed)
2nd attempt to contact patient to schedule Modified Barium Swallow - left voicemail. 

## 2022-06-09 NOTE — Telephone Encounter (Signed)
Let him know I did that when I placed the referral on the 16th of this month to [facility], if he has not gotten an appointment already he should go ahead and call them.

## 2022-06-09 NOTE — Telephone Encounter (Signed)
Patient has been updated and informed of the insurance outcome. Verbalized understanding. He would like provider to order the emg/ncv testing. No other inquiries during the call.

## 2022-06-09 NOTE — Telephone Encounter (Signed)
Auth were denied from insurance for MR cervical spine, MR lumbar spine, and MR thoracic spine.  Per insurance:  Patient will need to complete or must have had an electromyogram combine with a nerve conduction study or EMG/NCV (a test that shows how well your nerves and muscles are working together) performed after their symptoms started or change that show a reason the requested study is needed.   This coverage denial was based on the terms of the member's benefit plan document.  Provider can appeal at [id number]. Case/Ref # [id number].

## 2022-06-09 NOTE — Telephone Encounter (Signed)
Okay please let patient know that insurance company is not going to pay for the MRIs until he gets the nerve conduction and EMG.  We can revisit this after I get the results.

## 2022-06-10 DIAGNOSIS — G4733 Obstructive sleep apnea (adult) (pediatric): Secondary | ICD-10-CM | POA: Diagnosis not present

## 2022-06-10 NOTE — Telephone Encounter (Signed)
Task completed. At patient's request - MyChart msg sent to the patient with the information regarding the Neurology referral along with the direct contact information for the specialist office.

## 2022-06-16 ENCOUNTER — Telehealth (HOSPITAL_COMMUNITY): Payer: Self-pay

## 2022-06-16 NOTE — Telephone Encounter (Signed)
Called and spoke with patient to schedule Modified Barium Swallow - patient stated this test his not on his priority list as he has a lot going on with other appointments and his daughters wedding. He stated he will contact MD with a new order when he is ready to schedule. Close current order.

## 2022-06-23 ENCOUNTER — Other Ambulatory Visit: Payer: Self-pay | Admitting: Sports Medicine

## 2022-06-23 DIAGNOSIS — E538 Deficiency of other specified B group vitamins: Secondary | ICD-10-CM

## 2022-06-29 ENCOUNTER — Encounter: Payer: Self-pay | Admitting: Family Medicine

## 2022-06-29 ENCOUNTER — Ambulatory Visit (INDEPENDENT_AMBULATORY_CARE_PROVIDER_SITE_OTHER): Payer: 59 | Admitting: Family Medicine

## 2022-06-29 ENCOUNTER — Ambulatory Visit (INDEPENDENT_AMBULATORY_CARE_PROVIDER_SITE_OTHER): Payer: 59

## 2022-06-29 ENCOUNTER — Other Ambulatory Visit: Payer: Self-pay | Admitting: Sports Medicine

## 2022-06-29 VITALS — BP 122/75 | HR 74 | Ht 72.0 in | Wt 195.0 lb

## 2022-06-29 DIAGNOSIS — E559 Vitamin D deficiency, unspecified: Secondary | ICD-10-CM

## 2022-06-29 DIAGNOSIS — R052 Subacute cough: Secondary | ICD-10-CM

## 2022-06-29 DIAGNOSIS — R0989 Other specified symptoms and signs involving the circulatory and respiratory systems: Secondary | ICD-10-CM

## 2022-06-29 DIAGNOSIS — R059 Cough, unspecified: Secondary | ICD-10-CM | POA: Diagnosis not present

## 2022-06-29 DIAGNOSIS — E538 Deficiency of other specified B group vitamins: Secondary | ICD-10-CM | POA: Diagnosis not present

## 2022-06-29 DIAGNOSIS — I10 Essential (primary) hypertension: Secondary | ICD-10-CM | POA: Diagnosis not present

## 2022-06-29 MED ORDER — IPRATROPIUM BROMIDE 0.03 % NA SOLN: 2.0000 | Freq: Two times a day (BID) | NASAL | 1 refills | Status: DC

## 2022-06-29 NOTE — Progress Notes (Signed)
Hi Clinton Cox,  Chest x-ray is clear.  No active pulmonary disease which is great.  It sounds like it is more in your upper chest area.  Fortunately the cough after a viral illness can last for weeks even a month or 2 sometimes.  I would like to try an Atrovent nasal spray to see if we can dry up any excess secretions that could be draining into your upper airway.  I will send over prescription to your pharmacy to try.

## 2022-06-29 NOTE — Assessment & Plan Note (Signed)
Well controlled. Continue current regimen. Follow up in  6 mo  

## 2022-06-29 NOTE — Progress Notes (Signed)
Established Patient Office Visit  Subjective   Patient ID: Clinton Cox, male    DOB: March 08, 1966  Age: 57 y.o. MRN: [id number]  Chief Complaint  Patient presents with   mood   throat clearing   Wheezing    X 3weeks    HPI  Notices some throat clearing and choking, and coughing  with eating that started after he had COVID around Christmas time is about 7 weeks ago.  He sometimes even feels like his chest is full.  When he does cough up a little bit of sputum is mostly clear or white.  He denies any nasal congestion but does feel like he is having some drainage into his throat.  No recent GERD or reflux symptoms.  He is also had some occasional soreness in his mid back and is concerned about possibility of pneumonia.  He has had a prior history of bilateral pneumonia from [place].  And a family history of lung cancer with his mom and dad.  No fever.  He was recently started on supplements for B12 and vitamin D and would like to have those rechecked today.  Also has an upcoming appoint with neurology next week as well as nerve conduction study some for some neuromotor weakness that was evaluated by sports medicine.    ROS    Objective:     BP 122/75   Pulse 74   Ht 6' (1.829 m)   Wt 195 lb (88.5 kg)   SpO2 100%   BMI 26.45 kg/m    Physical Exam Constitutional:      Appearance: He is well-developed.  HENT:     Head: Normocephalic and atraumatic.     Right Ear: Tympanic membrane, ear canal and external ear normal.     Left Ear: Tympanic membrane, ear canal and external ear normal.     Nose: Nose normal.     Mouth/Throat:     Pharynx: Oropharynx is clear.  Eyes:     Conjunctiva/sclera: Conjunctivae normal.     Pupils: Pupils are equal, round, and reactive to light.  Neck:     Thyroid: No thyromegaly.  Cardiovascular:     Rate and Rhythm: Normal rate and regular rhythm.     Heart sounds: Normal heart sounds.  Pulmonary:     Effort: Pulmonary effort is normal.      Breath sounds: Normal breath sounds.  Musculoskeletal:     Cervical back: Neck supple. No tenderness.  Lymphadenopathy:     Cervical: No cervical adenopathy.  Skin:    General: Skin is warm and dry.  Neurological:     Mental Status: He is alert and oriented to person, place, and time.  Psychiatric:        Behavior: Behavior normal.      No results found for any visits on 06/29/22.    The 10-year ASCVD risk score (Arnett DK, et al., 2019) is: 5%    Assessment & Plan:   Problem List Items Addressed This Visit       Cardiovascular and Mediastinum   Essential hypertension, benign    Well controlled. Continue current regimen. Follow up in  81mo        Other   Vitamin D deficiency   B12 deficiency   Other Visit Diagnoses     Subacute cough    -  Primary   Relevant Orders   DG Chest 2 View   Throat clearing       Relevant Orders  DG Chest 2 View       Vitamin B12 deficiency-has been taking supplements for about 3-1/2 weeks.  Plan to recheck levels today.  We did discuss it still a little bit early so were just looking for trend upward.  But he will not be fully therapeutic for would likely several months.  Cough/throat clearing-we discussed that it sounds more like persistent upper airway syndrome versus a true lung issue but we will get a chest x-ray today for further workup.  He has no significant nasal congestion.  Consider Atrovent nasal spray to dry up any extra secretions he denies any GERD symptoms so reflux is less likely.  Return in about 6 months (around 12/28/2022) for Hypertension.    Beatrice Lecher, MD

## 2022-07-01 LAB — VITAMIN D 25 HYDROXY (VIT D DEFICIENCY, FRACTURES): Vit D, 25-Hydroxy: 65 ng/mL (ref 30–100)

## 2022-07-01 LAB — VITAMIN B12: Vitamin B-12: 341 pg/mL (ref 200–1100)

## 2022-07-01 LAB — METHYLMALONIC ACID, SERUM: Methylmalonic Acid, Quant: 112 nmol/L (ref 87–318)

## 2022-07-02 ENCOUNTER — Encounter: Payer: Self-pay | Admitting: Sports Medicine

## 2022-07-02 DIAGNOSIS — G5603 Carpal tunnel syndrome, bilateral upper limbs: Secondary | ICD-10-CM | POA: Diagnosis not present

## 2022-07-02 DIAGNOSIS — M5412 Radiculopathy, cervical region: Secondary | ICD-10-CM

## 2022-07-02 DIAGNOSIS — M542 Cervicalgia: Secondary | ICD-10-CM

## 2022-07-06 ENCOUNTER — Encounter: Payer: Self-pay | Admitting: Family Medicine

## 2022-07-11 DIAGNOSIS — G4733 Obstructive sleep apnea (adult) (pediatric): Secondary | ICD-10-CM | POA: Diagnosis not present

## 2022-07-21 ENCOUNTER — Other Ambulatory Visit: Payer: Self-pay | Admitting: Family Medicine

## 2022-08-09 ENCOUNTER — Other Ambulatory Visit: Payer: Self-pay | Admitting: Family Medicine

## 2022-08-09 DIAGNOSIS — G4733 Obstructive sleep apnea (adult) (pediatric): Secondary | ICD-10-CM | POA: Diagnosis not present

## 2022-09-09 DIAGNOSIS — G4733 Obstructive sleep apnea (adult) (pediatric): Secondary | ICD-10-CM | POA: Diagnosis not present

## 2022-09-10 ENCOUNTER — Ambulatory Visit: Payer: 59 | Admitting: Family Medicine

## 2022-09-10 VITALS — BP 136/82 | HR 83 | Ht 72.0 in | Wt 195.5 lb

## 2022-09-10 DIAGNOSIS — M5412 Radiculopathy, cervical region: Secondary | ICD-10-CM | POA: Diagnosis not present

## 2022-09-10 DIAGNOSIS — F4321 Adjustment disorder with depressed mood: Secondary | ICD-10-CM

## 2022-09-10 DIAGNOSIS — R200 Anesthesia of skin: Secondary | ICD-10-CM

## 2022-09-10 DIAGNOSIS — L821 Other seborrheic keratosis: Secondary | ICD-10-CM | POA: Diagnosis not present

## 2022-09-10 DIAGNOSIS — R202 Paresthesia of skin: Secondary | ICD-10-CM | POA: Diagnosis not present

## 2022-09-10 NOTE — Telephone Encounter (Signed)
Hi Vanicia, could we look into his cervical, thoracic, and lumbar spine MRI again?  Is now been 3 months of symptoms.  Do I need to place new orders?

## 2022-09-10 NOTE — Progress Notes (Addendum)
Established Patient Office Visit  Subjective   Patient ID: Clinton Cox, male    DOB: 1965-10-23  Age: 57 y.o. MRN: [id number]  Chief Complaint  Patient presents with   numbness/ twitching sensation    Numbness/ twitching sensation pointer and thumb of right hand, discomfort of Right wirst and some changes in control of movement of right arm.    lesion  forehead    Lesion right side of forehead x one year- has gotten larger.     HPI He says he has been having some numbness around his right wrist and into his right hand.  He also feels like there is some discoordination and he will feel like he wants to move his hand to the right but then it goes to the left.  He is very concerned about the possibility of MS or ALS or some type of neurodegenerative disorder.  He did see Dr. Benjamin Stain back in January and they did some an initial evaluation he has a prior history of C6 impingement on the cervical spine he had an MRI done several years ago.  They have referred him for nerve conduction testing which did confirm C6 and C7 cervical radiculopathy as well as some minimal median neuropathy bilaterally.  He says he has not heard back about the MRI yet.  He has a has a lesion on his forehead to the right side just near the temple.  Is not painful or bothersome but he wanted to make sure that it was okay.  He also recently just lost his son to addiction to fentanyl.  He says his son was actually recovering and had just completed a rehab program and was actually speaking to others and he relapsed.  Unfortunately he did pass away in the hospital in [place] they just had the funeral this past weekend.    ROS    Objective:     BP 136/82   Pulse 83   Ht 6' (1.829 m)   Wt 195 lb 8 oz (88.7 kg)   SpO2 99%   BMI 26.51 kg/m    Physical Exam Constitutional:      Appearance: He is well-developed.  HENT:     Head: Normocephalic and atraumatic.  Cardiovascular:     Rate and Rhythm: Normal  rate and regular rhythm.     Heart sounds: Normal heart sounds.  Pulmonary:     Effort: Pulmonary effort is normal.     Breath sounds: Normal breath sounds.  Skin:    General: Skin is warm and dry.  Neurological:     Mental Status: He is alert and oriented to person, place, and time.  Psychiatric:        Behavior: Behavior normal.      No results found for any visits on 09/10/22.    The 10-year ASCVD risk score (Arnett DK, et al., 2019) is: 6.6%    Assessment & Plan:   Problem List Items Addressed This Visit       Nervous and Auditory   RESOLVED: Cervical radiculopathy at C7   RESOLVED: Cervical radiculopathy at C6   Other Visit Diagnoses     Seborrheic keratosis    -  Primary   Numbness and tingling in right hand       Grief           Seb Keratosis-discussed benign nature of lesion.  We can do cryotherapy anytime.  C6 and C7 cervical radiculopathy noted on his recent nerve conduction studies.  We should be able to move forward with MRI since he still having persistent symptoms.  Will just need to get this additional information over to the insurance company to get it approved and notify Dr. Rodney Langton who is managing the symptoms.  Grief - he feels like right now he is got a good support network.  If at any point we can be helpful just let us know.  He had noticed initially some weakness in his legs but since starting the B12 supplement and the vitamin D he is actually felt better in that regard.  So we will continue to monitor.  He did have follow-up labs to confirm that levels were improving.  No follow-ups on file.    Nani Gasser, MD

## 2022-09-10 NOTE — Patient Instructions (Addendum)
The lesion on your forehead near the temple area it is a seborrheic keratoses.  These are benign and usually inherited it can be treated by freezing them off.  We can schedule that at your convenience if you would like to do so.  We will check on the MRI.  It looks like it was ordered but they were awaiting the results of the nerve conduction and I think somehow we did not get that communicated over to the insurance to get it approved.  So we will reinitiate the process.

## 2022-09-11 NOTE — Telephone Encounter (Signed)
He did do the EMG and nerve conduction study that did show cervical radiculopathy, I will reorder the cervical spine MRI.

## 2022-09-14 NOTE — Telephone Encounter (Signed)
Noted  

## 2022-09-18 ENCOUNTER — Telehealth: Payer: Self-pay

## 2022-09-18 NOTE — Telephone Encounter (Signed)
Insurance denied approval for MRI for cervical spine wo contrast imaging order. Per insurance, there is denial on file for the same study within 90 days.   Peer to peer required by provider. Call info is [phone]. Case #[id number].

## 2022-09-19 ENCOUNTER — Other Ambulatory Visit: Payer: Self-pay | Admitting: Family Medicine

## 2022-09-21 NOTE — Telephone Encounter (Signed)
Spoke to [employer], they said they have not received any clinicals, I faxed over the clinicals and awaiting a callback.  The clinicals were faxed to [phone] with case #[id number].

## 2022-09-28 ENCOUNTER — Telehealth: Payer: Self-pay

## 2022-09-28 NOTE — Telephone Encounter (Signed)
MRI Spine wo contrast auth denied from insurance. Per insurance:  We cannot approve this request because:  Your records do not show documentation of physical exam results that included a detailed nervous system (a network of nerve cells and fibers that send nerve messages between parts of your body) exam performed after your symptoms started or changed that support imaging. Imaging requires six weeks of provider directed treatment to be completed. This must have been completed in the past three months without improved symptoms. Contact (via office visit, phone, email, or messaging) must occur after the treatment is completed.  This has not been met because: You have not completed six weeks of provider directed treatment.  The provider directed treatment did not occur within the last three months.  Symptoms must be the same or worse after treatment to support imaging.  There was no contact with your provider after completing treatment.   Provider can appeal at [phone]. Case/Ref # [id number].

## 2022-09-29 NOTE — Telephone Encounter (Signed)
This task has been completed.

## 2022-09-29 NOTE — Telephone Encounter (Signed)
Noted. MRI order has been updated. Imaging dept notified of the update. Patient will be contacted for scheduling to complete the order.

## 2022-09-29 NOTE — Telephone Encounter (Signed)
Approval: [id number] exp: Mar 28, 2023.

## 2022-10-09 DIAGNOSIS — G4733 Obstructive sleep apnea (adult) (pediatric): Secondary | ICD-10-CM | POA: Diagnosis not present

## 2022-10-10 ENCOUNTER — Ambulatory Visit (INDEPENDENT_AMBULATORY_CARE_PROVIDER_SITE_OTHER): Payer: 59

## 2022-10-10 DIAGNOSIS — M4802 Spinal stenosis, cervical region: Secondary | ICD-10-CM | POA: Diagnosis not present

## 2022-10-10 DIAGNOSIS — M542 Cervicalgia: Secondary | ICD-10-CM

## 2022-10-10 DIAGNOSIS — M5412 Radiculopathy, cervical region: Secondary | ICD-10-CM

## 2022-10-13 ENCOUNTER — Encounter: Payer: Self-pay | Admitting: Sports Medicine

## 2022-10-13 ENCOUNTER — Encounter: Payer: Self-pay | Admitting: Family Medicine

## 2022-11-09 DIAGNOSIS — G4733 Obstructive sleep apnea (adult) (pediatric): Secondary | ICD-10-CM | POA: Diagnosis not present

## 2022-11-12 DIAGNOSIS — G4733 Obstructive sleep apnea (adult) (pediatric): Secondary | ICD-10-CM | POA: Diagnosis not present

## 2022-11-15 ENCOUNTER — Encounter: Payer: Self-pay | Admitting: Family Medicine

## 2022-11-16 ENCOUNTER — Encounter: Payer: Self-pay | Admitting: Family Medicine

## 2022-11-16 ENCOUNTER — Ambulatory Visit (INDEPENDENT_AMBULATORY_CARE_PROVIDER_SITE_OTHER): Payer: 59 | Admitting: Family Medicine

## 2022-11-16 VITALS — BP 134/95 | HR 78 | Ht 72.0 in | Wt 196.0 lb

## 2022-11-16 DIAGNOSIS — F411 Generalized anxiety disorder: Secondary | ICD-10-CM | POA: Diagnosis not present

## 2022-11-16 DIAGNOSIS — R002 Palpitations: Secondary | ICD-10-CM | POA: Diagnosis not present

## 2022-11-16 DIAGNOSIS — R5383 Other fatigue: Secondary | ICD-10-CM

## 2022-11-16 DIAGNOSIS — E559 Vitamin D deficiency, unspecified: Secondary | ICD-10-CM

## 2022-11-16 DIAGNOSIS — E538 Deficiency of other specified B group vitamins: Secondary | ICD-10-CM

## 2022-11-16 MED ORDER — ESCITALOPRAM OXALATE 10 MG PO TABS
ORAL_TABLET | ORAL | 1 refills | Status: DC
Start: 2022-11-16 — End: 2022-12-10

## 2022-11-16 NOTE — Assessment & Plan Note (Signed)
I do think a significant component of anxiety is contributing to his symptoms as well as grief.  Okay with referral for therapy and considering starting an SSRI.  Sertraline made him feel like a zombie and he took fluoxetine for 12 to 14 years and does not want to go back on that particular medication again.

## 2022-11-16 NOTE — Assessment & Plan Note (Signed)
Hx of intermittent palpitations since at least 2016.

## 2022-11-16 NOTE — Telephone Encounter (Signed)
Patient scheduled with Dr. Linford Arnold today at 10:30am =

## 2022-11-16 NOTE — Progress Notes (Signed)
Acute Office Visit  Subjective:     Patient ID: Clinton Cox, male    DOB: 1966/02/06, 57 y.o.   MRN: [id number]  Chief Complaint  Patient presents with   Fatigue    HPI Patient is in today for fatigue and lower extremity weakness for about 6 weeks.  Had some similar symptoms that were mild a couple months ago but he started taking supplements for B12 deficiency and started to feel little bit better.  But over the last 6 weeks he is felt much more fatigued. Has been going to bed early.  Is like he is lost interest in things like going golfing.  Sudden thoughts will really trigger his emotions.  Also noticed a shift in mood.  He has been a little bit more irritable.  Has had some palpitations.  Had one night where he woke up shaking and took at [employer].  Is maybe a week or so ago he had an episode of palpitations and chest tightness that lasted almost 2 hours on and off.  Is lying in bed.  He eventually got up and took his diet benzodiazepine hoping that it would relax him just in case it was anxiety.  And then he said he sat in bed and had the shakes for most 20 minutes before he finally fell asleep.  He is just felt really exhausted since then.  ROS      Objective:    BP (!) 134/95   Pulse 78   Ht 6' (1.829 m)   Wt 196 lb (88.9 kg)   SpO2 99%   BMI 26.58 kg/m    Physical Exam Constitutional:      Appearance: He is well-developed.  HENT:     Head: Normocephalic and atraumatic.  Cardiovascular:     Rate and Rhythm: Normal rate and regular rhythm.     Heart sounds: Normal heart sounds.  Pulmonary:     Effort: Pulmonary effort is normal.     Breath sounds: Normal breath sounds.  Skin:    General: Skin is warm and dry.  Neurological:     Mental Status: He is alert and oriented to person, place, and time.  Psychiatric:        Behavior: Behavior normal.     No results found for any visits on 11/16/22.      Assessment & Plan:   Problem List Items Addressed  This Visit       Other   Vitamin D deficiency   Relevant Orders   COMPLETE METABOLIC PANEL WITH GFR   CBC with Differential/Platelet   Sedimentation rate   TSH   VITAMIN D 25 Hydroxy (Vit-D Deficiency, Fractures)   B12   Vitamin B6   Vitamin B1   Testosterone Total,Free,Bio, Males   Hemoglobin A1c   Palpitations - Primary    Hx of intermittent palpitations since at least 2016.        Relevant Orders   COMPLETE METABOLIC PANEL WITH GFR   CBC with Differential/Platelet   Sedimentation rate   TSH   VITAMIN D 25 Hydroxy (Vit-D Deficiency, Fractures)   B12   Vitamin B6   Vitamin B1   Testosterone Total,Free,Bio, Males   Hemoglobin A1c   GAD (generalized anxiety disorder)    I do think a significant component of anxiety is contributing to his symptoms as well as grief.  Okay with referral for therapy and considering starting an SSRI.  Sertraline made him feel like a zombie and he  took fluoxetine for 12 to 14 years and does not want to go back on that particular medication again.      Relevant Medications   escitalopram (LEXAPRO) 10 MG tablet   Other Relevant Orders   COMPLETE METABOLIC PANEL WITH GFR   CBC with Differential/Platelet   Sedimentation rate   TSH   VITAMIN D 25 Hydroxy (Vit-D Deficiency, Fractures)   B12   Vitamin B6   Vitamin B1   Testosterone Total,Free,Bio, Males   Hemoglobin A1c   Fatigue   Relevant Orders   COMPLETE METABOLIC PANEL WITH GFR   CBC with Differential/Platelet   Sedimentation rate   TSH   VITAMIN D 25 Hydroxy (Vit-D Deficiency, Fractures)   B12   Vitamin B6   Vitamin B1   Testosterone Total,Free,Bio, Males   Hemoglobin A1c   B12 deficiency   Relevant Orders   COMPLETE METABOLIC PANEL WITH GFR   CBC with Differential/Platelet   Sedimentation rate   TSH   VITAMIN D 25 Hydroxy (Vit-D Deficiency, Fractures)   B12   Vitamin B6   Vitamin B1   Testosterone Total,Free,Bio, Males   Hemoglobin A1c   EKG today shows rate of 76  bpm, normal sinus rhythm with no acute ST-T wave changes.  No change from prior EKG.  Will get additional labs today just to rule out potential causes for severe fatigue over the last few weeks.  Potation's-EKG normal and reassuring today he has had chronic palpitations but they just seem to have flared recently.  I do think some of this is stress related with the recent loss of his son.  We discussed possibly getting back on an SSRI at least just for the short-term may be 6 to 12 months.  Also discussed therapy/counseling to help with his grief.  He says he is open to it.    Meds ordered this encounter  Medications   escitalopram (LEXAPRO) 10 MG tablet    Sig: Take 0.5 tablets (5 mg total) by mouth daily for 10 days, THEN 1 tablet (10 mg total) daily for 20 days.    Dispense:  30 tablet    Refill:  1    No follow-ups on file. I spent 30 minutes on the day of the encounter to include pre-visit record review, face-to-face time with the patient and post visit ordering of test.  Nani Gasser, MD

## 2022-11-17 LAB — COMPLETE METABOLIC PANEL WITH GFR: Total Bilirubin: 0.7 mg/dL (ref 0.2–1.2)

## 2022-11-17 LAB — CBC WITH DIFFERENTIAL/PLATELET
MPV: 9.2 fL (ref 7.5–12.5)
WBC: 5.7 10*3/uL (ref 3.8–10.8)

## 2022-11-17 NOTE — Progress Notes (Signed)
Labs look good so far.  B1 and B6 are still pending

## 2022-11-19 ENCOUNTER — Encounter: Payer: Self-pay | Admitting: Family Medicine

## 2022-11-19 LAB — CBC WITH DIFFERENTIAL/PLATELET
Absolute Monocytes: 536 {cells}/uL (ref 200–950)
Basophils Absolute: 40 {cells}/uL (ref 0–200)
Basophils Relative: 0.7 %
Eosinophils Absolute: 91 {cells}/uL (ref 15–500)
Eosinophils Relative: 1.6 %
HCT: 49.3 % (ref 38.5–50.0)
Hemoglobin: 16.8 g/dL (ref 13.2–17.1)
Lymphs Abs: 1625 cells/uL (ref 850–3900)
MCH: 31.8 pg (ref 27.0–33.0)
MCHC: 34.1 g/dL (ref 32.0–36.0)
MCV: 93.4 fL (ref 80.0–100.0)
Monocytes Relative: 9.4 %
Neutro Abs: 3409 {cells}/uL (ref 1500–7800)
Neutrophils Relative %: 59.8 %
Platelets: 317 10*3/uL (ref 140–400)
RBC: 5.28 10*6/uL (ref 4.20–5.80)
RDW: 11.9 % (ref 11.0–15.0)
Total Lymphocyte: 28.5 %

## 2022-11-19 LAB — TESTOSTERONE TOTAL,FREE,BIO, MALES
Albumin: 4.7 g/dL (ref 3.6–5.1)
Sex Hormone Binding: 63 nmol/L (ref 22–77)
Testosterone, Bioavailable: 117.1 ng/dL (ref 110.0–575.0)
Testosterone, Free: 54.6 pg/mL (ref 46.0–224.0)
Testosterone: 696 ng/dL (ref 250–827)

## 2022-11-19 LAB — COMPLETE METABOLIC PANEL WITHOUT GFR
AG Ratio: 2 (calc) (ref 1.0–2.5)
ALT: 16 U/L (ref 9–46)
Albumin: 4.7 g/dL (ref 3.6–5.1)
Alkaline phosphatase (APISO): 59 U/L (ref 35–144)
BUN: 11 mg/dL (ref 7–25)
CO2: 30 mmol/L (ref 20–32)
Calcium: 9.5 mg/dL (ref 8.6–10.3)
Chloride: 103 mmol/L (ref 98–110)
Creat: 1.01 mg/dL (ref 0.70–1.30)
Globulin: 2.3 g/dL (ref 1.9–3.7)
Glucose, Bld: 92 mg/dL (ref 65–99)
Potassium: 4.7 mmol/L (ref 3.5–5.3)
Sodium: 140 mmol/L (ref 135–146)
Total Protein: 7 g/dL (ref 6.1–8.1)
eGFR: 87 mL/min/{1.73_m2} (ref >=60)

## 2022-11-19 LAB — HEMOGLOBIN A1C
Hgb A1c MFr Bld: 5.3 %{Hb} (ref <5.7)
Mean Plasma Glucose: 105 mg/dL
eAG (mmol/L): 5.8 mmol/L

## 2022-11-19 LAB — COMPLETE METABOLIC PANEL WITH GFR: AST: 16 U/L (ref 10–35)

## 2022-11-19 LAB — VITAMIN B1: Vitamin B1 (Thiamine): 8 nmol/L (ref 8–30)

## 2022-11-19 LAB — VITAMIN B6: Vitamin B6: 10.7 ng/mL (ref 2.1–21.7)

## 2022-11-19 LAB — TSH: TSH: 3.63 mIU/L (ref 0.40–4.50)

## 2022-11-19 LAB — VITAMIN D 25 HYDROXY (VIT D DEFICIENCY, FRACTURES): Vit D, 25-Hydroxy: 71 ng/mL (ref 30–100)

## 2022-11-19 LAB — SEDIMENTATION RATE: Sed Rate: 2 mm/h (ref 0–20)

## 2022-11-19 LAB — VITAMIN B12: Vitamin B-12: 464 pg/mL (ref 200–1100)

## 2022-11-20 ENCOUNTER — Other Ambulatory Visit: Payer: Self-pay

## 2022-11-20 ENCOUNTER — Other Ambulatory Visit: Payer: Self-pay | Admitting: *Deleted

## 2022-11-20 DIAGNOSIS — R5383 Other fatigue: Secondary | ICD-10-CM

## 2022-11-20 NOTE — Progress Notes (Signed)
Hi Clinton Cox, B6 looks great.  B1 is low it was low 2 years ago as well.  I would definitely recommend starting an over-the-counter vitamin B1 supplement.  It is also called thiamine.  And then plan will be to recheck your level again in about 8 to 12 weeks to make sure that your body is absorbing it and that your levels are coming up nicely.  But yes, vitamin B1 can cause fatigue so I definitely want to see if you notice if you are feeling any better.

## 2022-11-23 NOTE — Addendum Note (Signed)
Addended by: Chalmers Cater on: 11/23/2022 10:08 AM   Modules accepted: Orders

## 2022-12-07 ENCOUNTER — Encounter: Payer: Self-pay | Admitting: Family Medicine

## 2022-12-07 DIAGNOSIS — R002 Palpitations: Secondary | ICD-10-CM

## 2022-12-07 DIAGNOSIS — R197 Diarrhea, unspecified: Secondary | ICD-10-CM

## 2022-12-09 ENCOUNTER — Other Ambulatory Visit: Payer: Self-pay | Admitting: Family Medicine

## 2022-12-09 DIAGNOSIS — F411 Generalized anxiety disorder: Secondary | ICD-10-CM

## 2022-12-09 DIAGNOSIS — G4733 Obstructive sleep apnea (adult) (pediatric): Secondary | ICD-10-CM | POA: Diagnosis not present

## 2022-12-12 DIAGNOSIS — G4733 Obstructive sleep apnea (adult) (pediatric): Secondary | ICD-10-CM | POA: Diagnosis not present

## 2022-12-15 NOTE — Telephone Encounter (Signed)
OK to liver function to labs. All will need to be reordered.

## 2022-12-15 NOTE — Addendum Note (Signed)
Addended by: Chalmers Cater on: 12/15/2022 10:55 AM   Modules accepted: Orders

## 2022-12-15 NOTE — Telephone Encounter (Signed)
Added labs. Reordered all labs. There is still two separate orders.

## 2022-12-24 ENCOUNTER — Other Ambulatory Visit: Payer: Self-pay | Admitting: *Deleted

## 2022-12-24 DIAGNOSIS — R5383 Other fatigue: Secondary | ICD-10-CM

## 2022-12-24 DIAGNOSIS — R002 Palpitations: Secondary | ICD-10-CM | POA: Diagnosis not present

## 2022-12-24 DIAGNOSIS — R197 Diarrhea, unspecified: Secondary | ICD-10-CM | POA: Diagnosis not present

## 2022-12-25 NOTE — Progress Notes (Signed)
[place], liver function looks great.  Still waiting for vitamin B1.

## 2022-12-28 ENCOUNTER — Ambulatory Visit: Payer: 59 | Attending: Family Medicine

## 2022-12-28 ENCOUNTER — Encounter: Payer: Self-pay | Admitting: Family Medicine

## 2022-12-28 ENCOUNTER — Ambulatory Visit (INDEPENDENT_AMBULATORY_CARE_PROVIDER_SITE_OTHER): Payer: 59 | Admitting: Family Medicine

## 2022-12-28 VITALS — BP 123/70 | HR 83 | Ht 72.0 in | Wt 197.0 lb

## 2022-12-28 DIAGNOSIS — I10 Essential (primary) hypertension: Secondary | ICD-10-CM

## 2022-12-28 DIAGNOSIS — R002 Palpitations: Secondary | ICD-10-CM

## 2022-12-28 DIAGNOSIS — F411 Generalized anxiety disorder: Secondary | ICD-10-CM

## 2022-12-28 DIAGNOSIS — G4733 Obstructive sleep apnea (adult) (pediatric): Secondary | ICD-10-CM | POA: Diagnosis not present

## 2022-12-28 MED ORDER — CLONAZEPAM 0.5 MG PO TABS
0.5000 mg | ORAL_TABLET | Freq: Every day | ORAL | 0 refills | Status: DC | PRN
Start: 2022-12-28 — End: 2023-04-13

## 2022-12-28 MED ORDER — AMLODIPINE BESYLATE 10 MG PO TABS
ORAL_TABLET | ORAL | 3 refills | Status: DC
Start: 2022-12-28 — End: 2023-01-26

## 2022-12-28 NOTE — Progress Notes (Unsigned)
Enrolled for [employer] to mail a ZIO XT long term holter monitor to the patients address on file.   DOD to read. 

## 2022-12-28 NOTE — Telephone Encounter (Signed)
Patient message - requesting Klonopin as 90 day supply.  Last written 12/05/2020 qty #20 Last OV today 12/28/22 No yet schld upcoming appt

## 2022-12-28 NOTE — Progress Notes (Addendum)
   Established Patient Office Visit  Subjective   Patient ID: Clinton Cox, male    DOB: 1966/02/20  Age: 57 y.o. MRN: [id number]  Chief Complaint  Patient presents with   Hypertension    HPI Here today to follow-up on palpitations he has had a now third episode of feeling like his heartbeat is irregular.  It is quite intense at the time and took a couple hours this last time on Friday night for to feel like it had calm down but he is just felt exhausted and worn out for couple days after that.  He did not have any diarrhea with this episode but the one prior he did.  He reports he has been trying to wear his CPAP consistently.  And even bought a Fitbit so he could start tracking his heart rate.  He is taking his amlodipine and does need a 90-day supply.  Hypertension- Pt denies chest pain, SOB, dizziness, or heart palpitations.  Taking meds as directed w/o problems.  Denies medication side effects.   Wanted to mention his had an occasional stinging sensation in that left calf over the the last day or 2.  No swelling    ROS    Objective:     BP 123/70   Pulse 83   Ht 6' (1.829 m)   Wt 197 lb (89.4 kg)   SpO2 100%   BMI 26.72 kg/m    Physical Exam Constitutional:      Appearance: He is well-developed.  HENT:     Head: Normocephalic and atraumatic.  Cardiovascular:     Rate and Rhythm: Normal rate and regular rhythm.     Heart sounds: Normal heart sounds.  Pulmonary:     Effort: Pulmonary effort is normal.     Breath sounds: Normal breath sounds.  Skin:    General: Skin is warm and dry.  Neurological:     Mental Status: He is alert and oriented to person, place, and time.  Psychiatric:        Behavior: Behavior normal.      No results found for any visits on 12/28/22.    The 10-year ASCVD risk score (Arnett DK, et al., 2019) is: 5.5%    Assessment & Plan:   Problem List Items Addressed This Visit       Cardiovascular and Mediastinum   Essential  hypertension, benign - Primary    Pressure looks great today we will change the amlodipine back to 90-day supply.  Think at some point it got switched to 30 and has just been refilled that way.      Relevant Medications   amLODipine (NORVASC) 10 MG tablet     Other   Palpitations   Relevant Orders   LONG TERM MONITOR (3-14 DAYS)   Palpitations-we will go ahead and move forward with getting a cardiac monitor for 2 weeks.  When he has these episodes he actually feels quite fatigued afterwards so I did want to workup for an true arrhythmia I think some of the episodes that he is having are probably more consistent with PVCs or PACs.  But I think this will help differentiate that he did have an echocardiogram in October 2022 which was essentially normal and reassuring so we will hold off on repeat echo at this point in time.  No follow-ups on file.    Nani Gasser, MD

## 2022-12-28 NOTE — Assessment & Plan Note (Signed)
Pressure looks great today we will change the amlodipine back to 90-day supply.  Think at some point it got switched to 30 and has just been refilled that way.

## 2022-12-28 NOTE — Progress Notes (Signed)
Vitamin B1 looks great.

## 2022-12-30 DIAGNOSIS — R197 Diarrhea, unspecified: Secondary | ICD-10-CM | POA: Diagnosis not present

## 2022-12-30 DIAGNOSIS — R002 Palpitations: Secondary | ICD-10-CM | POA: Diagnosis not present

## 2023-01-02 DIAGNOSIS — R002 Palpitations: Secondary | ICD-10-CM

## 2023-01-06 LAB — CATECHOLAMINES, FRACTIONATED, URINE, 24 HOUR: Epinephrine, 24H Ur: <5 ug/24 hr (ref 0–20)

## 2023-01-09 DIAGNOSIS — G4733 Obstructive sleep apnea (adult) (pediatric): Secondary | ICD-10-CM | POA: Diagnosis not present

## 2023-01-12 DIAGNOSIS — G4733 Obstructive sleep apnea (adult) (pediatric): Secondary | ICD-10-CM | POA: Diagnosis not present

## 2023-01-19 ENCOUNTER — Encounter: Payer: Self-pay | Admitting: Family Medicine

## 2023-01-19 DIAGNOSIS — R002 Palpitations: Secondary | ICD-10-CM

## 2023-01-19 DIAGNOSIS — T7840XA Allergy, unspecified, initial encounter: Secondary | ICD-10-CM

## 2023-01-19 DIAGNOSIS — R5383 Other fatigue: Secondary | ICD-10-CM

## 2023-01-19 DIAGNOSIS — I10 Essential (primary) hypertension: Secondary | ICD-10-CM

## 2023-01-21 DIAGNOSIS — R002 Palpitations: Secondary | ICD-10-CM | POA: Diagnosis not present

## 2023-01-25 NOTE — Progress Notes (Signed)
Hi Clinton Cox, the heart monitor showed that you were mostly in sinus rhythm which is great that is what to be expected.  He did have some very rare episodes of a fast beat called SVT.  The fast this interval left is listed about 10 beats in a row and when it happened the heart rate went up to 144 beats.  There were some rare premature beats called PVCs these are not harmful or concerning.  No serious arrhythmias found.  I suspect that the palpitations that you were feeling were either brief runs of the SVT or possibly the early beats.  1 option we have is to put you on a low-dose beta-blocker which helps reduce the frequency of these episodes.  Use a medication such as metoprolol, propranolol, or atenolol.  The other option is that we do not have to treated at all with medication and just know that when they happen that they are benign.  The SVT is only concerning if it continues for a long period of time.  If you would feel more comfortable consulting with cardiology and wrist regard I am more than happy to place a referral as well.

## 2023-01-26 MED ORDER — AMLODIPINE BESYLATE 5 MG PO TABS
5.0000 mg | ORAL_TABLET | Freq: Every day | ORAL | 1 refills | Status: DC
Start: 2023-01-26 — End: 2023-12-27

## 2023-01-26 NOTE — Telephone Encounter (Signed)
Meds ordered this encounter  Medications   amLODipine (NORVASC) 5 MG tablet    Sig: Take 1 tablet (5 mg total) by mouth daily. TAKE 1 TABLET BY MOUTH DAILY.    Dispense:  90 tablet    Refill:  1   Orders Placed This Encounter  Procedures   Ambulatory referral to Cardiology    Referral Priority:   Routine    Referral Type:   Consultation    Referral Reason:   Specialty Services Required    Number of Visits Requested:   1

## 2023-01-29 NOTE — Addendum Note (Signed)
Addended by: Elizabeth Palau on: 01/29/2023 10:15 AM   Modules accepted: Orders

## 2023-01-29 NOTE — Telephone Encounter (Signed)
Pended referral for allergist.

## 2023-01-29 NOTE — Telephone Encounter (Signed)
K to stop B1 and B12.  Would recommend continuing vitamin D through the winter.

## 2023-01-29 NOTE — Addendum Note (Signed)
Addended by: Nani Gasser D on: 01/29/2023 05:01 PM   Modules accepted: Orders

## 2023-02-09 DIAGNOSIS — G4733 Obstructive sleep apnea (adult) (pediatric): Secondary | ICD-10-CM | POA: Diagnosis not present

## 2023-03-11 DIAGNOSIS — G4733 Obstructive sleep apnea (adult) (pediatric): Secondary | ICD-10-CM | POA: Diagnosis not present

## 2023-03-12 ENCOUNTER — Other Ambulatory Visit: Payer: Self-pay

## 2023-03-12 DIAGNOSIS — I10 Essential (primary) hypertension: Secondary | ICD-10-CM | POA: Insufficient documentation

## 2023-03-12 DIAGNOSIS — J984 Other disorders of lung: Secondary | ICD-10-CM | POA: Insufficient documentation

## 2023-03-15 ENCOUNTER — Ambulatory Visit: Payer: 59

## 2023-03-15 VITALS — BP 132/74 | HR 102 | Ht 72.0 in | Wt 188.0 lb

## 2023-03-15 DIAGNOSIS — R002 Palpitations: Secondary | ICD-10-CM

## 2023-03-15 DIAGNOSIS — Z789 Other specified health status: Secondary | ICD-10-CM | POA: Insufficient documentation
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
## 2023-03-15 NOTE — Assessment & Plan Note (Signed)
No significant increases in frequency or intensity of symptoms. Continue with watchful monitoring.  We reviewed options for empiric treatment with beta-blockers or calcium channel blockers. Otherwise the workup thus far is reassuring.  He is willing to follow this conservatively and will notify us if he notices any change in intensity or frequency of the symptoms or empirically wants to try suppression with medications.  Advised him to keep himself well-hydrated, avoid potential triggers such as excess alcohol consumption.  Advised to cut back caffeine [he drinks about 1 cup of coffee a day].

## 2023-03-15 NOTE — Patient Instructions (Signed)
Medication Instructions:  Your physician recommends that you continue on your current medications as directed. Please refer to the Current Medication list given to you today.  *If you need a refill on your cardiac medications before your next appointment, please call your pharmacy*   Lab Work: None ordered If you have labs (blood work) drawn today and your tests are completely normal, you will receive your results only by: MyChart Message (if you have MyChart) OR A paper copy in the mail If you have any lab test that is abnormal or we need to change your treatment, we will call you to review the results.   Testing/Procedures: None ordered   Follow-Up: At [facility], you and your health needs are our priority.  As part of our continuing mission to provide you with exceptional heart care, we have created designated Provider Care Teams.  These Care Teams include your primary Cardiologist (physician) and Advanced Practice Providers (APPs -  Physician Assistants and Nurse Practitioners) who all work together to provide you with the care you need, when you need it.  We recommend signing up for the patient portal called "MyChart".  Sign up information is provided on this After Visit Summary.  MyChart is used to connect with patients for Virtual Visits (Telemedicine).  Patients are able to view lab/test results, encounter notes, upcoming appointments, etc.  Non-urgent messages can be sent to your provider as well.   To learn more about what you can do with MyChart, go to [email or web address].    Your next appointment:   As needed  The format for your next appointment:   In Person  Provider:   Huntley Dec, MD    Other Instructions none  Important Information About Sugar

## 2023-03-15 NOTE — Assessment & Plan Note (Signed)
Advised about potential harmful effects. He is aware and is attempting to cut back. Suggested no more than 1 drink on the days he consumes alcohol.

## 2023-03-16 ENCOUNTER — Encounter: Payer: Self-pay | Admitting: Family Medicine

## 2023-03-16 ENCOUNTER — Ambulatory Visit: Payer: 59 | Admitting: Family Medicine

## 2023-03-16 VITALS — BP 133/64 | HR 96 | Ht 72.0 in | Wt 187.0 lb

## 2023-03-16 DIAGNOSIS — T753XXA Motion sickness, initial encounter: Secondary | ICD-10-CM | POA: Diagnosis not present

## 2023-03-16 DIAGNOSIS — F411 Generalized anxiety disorder: Secondary | ICD-10-CM

## 2023-03-16 DIAGNOSIS — E538 Deficiency of other specified B group vitamins: Secondary | ICD-10-CM

## 2023-03-16 DIAGNOSIS — G4752 REM sleep behavior disorder: Secondary | ICD-10-CM | POA: Diagnosis not present

## 2023-03-16 DIAGNOSIS — E559 Vitamin D deficiency, unspecified: Secondary | ICD-10-CM

## 2023-03-16 DIAGNOSIS — Z789 Other specified health status: Secondary | ICD-10-CM | POA: Diagnosis not present

## 2023-03-16 DIAGNOSIS — R4189 Other symptoms and signs involving cognitive functions and awareness: Secondary | ICD-10-CM

## 2023-03-16 NOTE — Assessment & Plan Note (Signed)
We can work with the neurologist on that would not impact his RBD P.

## 2023-03-16 NOTE — Assessment & Plan Note (Signed)
Does feel like since coming off of the fluoxetine that the motions and movements that occur overnight have been better.  He is now sleeping back in his bed with his wife instead of on the couch because he was afraid he was going to hurt her.  But now his anxiety is really peaking and is very hesitant to get back on any type of SSRI or SNRI in this regard.  Right now he is using clonazepam at bedtime to help control his symptoms.

## 2023-03-16 NOTE — Progress Notes (Signed)
Established Patient Office Visit  Subjective   Patient ID: Clinton Cox, male    DOB: 1966/02/04  Age: 57 y.o. MRN: [id number]  No chief complaint on file.   HPI  He is particularly concerned because over the last several weeks he has been dealing with what he describes as motion sickness.  It is worse so if he is actually driving a vehicle but he feels like it is a little bit constantly there he just feels like something is off in his head.  He occasionally will get nauseated with it but is never vomited.  He has never had motion sickness before as a passenger in a car etc.  His biggest fear is that he is starting to develop dementia or Parkinson's from his REM sleep disorder.  He does drink about the equivalent of 4 liquor shots a day.  He has switched to just 1 beer instead over the last 2 days and is noticed that his pulse was running a little higher with a wider variability at night according to his smart watch.  Interested in having his vitamin levels rechecked.  He was on individual supplements for several deficiencies including vitamin D and B1.  Since his levels came back into the normal range she is just switched to a One-A-Day multivitamin but wants to make sure that it is adequate to keep his levels hall and that that is not contributing to some of the sensation he is experiencing.  He would like to consider consultation with a neurologist for some of his symptoms in particular his RB P.  He still has very high levels of anxiety.  Recently he is also been exercising more and had lost a most 10 pounds and has now become worried that he is losing weight because of something going on in his body.  He was on fluoxetine for almost 15 years and feels like that contributed to his RPB.    ROS    Objective:     BP 133/64   Pulse 96   Ht 6' (1.829 m)   Wt 187 lb (84.8 kg)   SpO2 99%   BMI 25.36 kg/m    Physical Exam Vitals reviewed.  Constitutional:      Appearance:  Normal appearance.  HENT:     Head: Normocephalic.  Pulmonary:     Effort: Pulmonary effort is normal.  Neurological:     Mental Status: He is alert and oriented to person, place, and time.  Psychiatric:        Mood and Affect: Mood normal.        Behavior: Behavior normal.      No results found for any visits on 03/16/23.    The 10-year ASCVD risk score (Arnett DK, et al., 2019) is: 6.4%    Assessment & Plan:   Problem List Items Addressed This Visit       Other   Vitamin D deficiency   Relevant Orders   Vitamin B1   B12 and Folate Panel   B12   Vitamin B6   Fe+TIBC+Fer   VITAMIN D 25 Hydroxy (Vit-D Deficiency, Fractures)   CBC with Differential/Platelet   RBD (REM behavioral disorder) - Primary    Does feel like since coming off of the fluoxetine that the motions and movements that occur overnight have been better.  He is now sleeping back in his bed with his wife instead of on the couch because he was afraid he was going to hurt  her.  But now his anxiety is really peaking and is very hesitant to get back on any type of SSRI or SNRI in this regard.  Right now he is using clonazepam at bedtime to help control his symptoms.      Relevant Orders   Ambulatory referral to Neurology   Vitamin B1   B12 and Folate Panel   B12   Vitamin B6   Fe+TIBC+Fer   VITAMIN D 25 Hydroxy (Vit-D Deficiency, Fractures)   CBC with Differential/Platelet   Moderate alcohol consumption    Did discuss, discontinuing alcohol completely for the next month.  He does not feel like he is addicted and so feels like he can easily just quit drinking I think eliminating that from the equation to see if this helps with his sleep quality as well as mood and some of the brain fog that he is experiencing.      GAD (generalized anxiety disorder)    We can work with the neurologist on that would not impact his RBD P.      Relevant Orders   Vitamin B1   B12 and Folate Panel   B12   Vitamin B6    Fe+TIBC+Fer   VITAMIN D 25 Hydroxy (Vit-D Deficiency, Fractures)   CBC with Differential/Platelet   B12 deficiency   Relevant Orders   Vitamin B1   B12 and Folate Panel   B12   Vitamin B6   Fe+TIBC+Fer   VITAMIN D 25 Hydroxy (Vit-D Deficiency, Fractures)   CBC with Differential/Platelet   Other Visit Diagnoses     Motion sickness, initial encounter       Relevant Orders   Vitamin B1   B12 and Folate Panel   B12   Vitamin B6   Fe+TIBC+Fer   VITAMIN D 25 Hydroxy (Vit-D Deficiency, Fractures)   CBC with Differential/Platelet   Brain fog       Relevant Orders   Vitamin B1   B12 and Folate Panel   B12   Vitamin B6   Fe+TIBC+Fer   VITAMIN D 25 Hydroxy (Vit-D Deficiency, Fractures)   CBC with Differential/Platelet       REM sleep behaviour disorder In REM sleep behaviour disorder (RBD), patients 'act out' their dreams during REM sleep, due to failure of the usual muscle atonia. Sleep partners provide typical histories of patients 'fighting' or 'struggling' in their sleep, sometimes causing injury to themselves or to their partner. They are easily roused from this state, with recollection of their dream, unlike in non-REM states. RBD is more common in men and may be an early symptom of neurodegenerative diseases such as alpha synucleinopathies, perhaps preceding more typical symptoms of these conditions by years. Polysomnography will confirm absence of atonia during REM sleep. Clonazepam is the most successful treatment.  Return in about 3 weeks (around 04/06/2023) for motion sickness and sleep .    Nani Gasser, MD

## 2023-03-16 NOTE — Assessment & Plan Note (Signed)
Did discuss, discontinuing alcohol completely for the next month.  He does not feel like he is addicted and so feels like he can easily just quit drinking I think eliminating that from the equation to see if this helps with his sleep quality as well as mood and some of the brain fog that he is experiencing.

## 2023-03-17 ENCOUNTER — Encounter: Payer: Self-pay | Admitting: Family Medicine

## 2023-03-17 DIAGNOSIS — H8110 Benign paroxysmal vertigo, unspecified ear: Secondary | ICD-10-CM

## 2023-03-23 LAB — VITAMIN B6: Vitamin B6: 41.2 ug/L (ref 3.4–65.2)

## 2023-03-23 LAB — CBC WITH DIFFERENTIAL/PLATELET
Basophils Absolute: 0.1 10*3/uL (ref 0.0–0.2)
Basos: 1 %
EOS (ABSOLUTE): 0.1 10*3/uL (ref 0.0–0.4)
Eos: 1 %
Hematocrit: 49.1 % (ref 37.5–51.0)
Hemoglobin: 16.2 g/dL (ref 13.0–17.7)
Immature Grans (Abs): 0 10*3/uL (ref 0.0–0.1)
Immature Granulocytes: 0 %
Lymphocytes Absolute: 1.1 10*3/uL (ref 0.7–3.1)
Lymphs: 19 %
MCH: 32.1 pg (ref 26.6–33.0)
MCHC: 33 g/dL (ref 31.5–35.7)
MCV: 97 fL (ref 79–97)
Monocytes Absolute: 0.6 10*3/uL (ref 0.1–0.9)
Monocytes: 10 %
Neutrophils Absolute: 4.1 10*3/uL (ref 1.4–7.0)
Neutrophils: 69 %
Platelets: 318 10*3/uL (ref 150–450)
RBC: 5.04 x10E6/uL (ref 4.14–5.80)
RDW: 11.8 % (ref 11.6–15.4)
WBC: 5.9 10*3/uL (ref 3.4–10.8)

## 2023-03-23 LAB — B12 AND FOLATE PANEL
Folate: >20 ng/mL (ref >3.0)
Vitamin B-12: 543 pg/mL (ref 232–1245)

## 2023-03-23 LAB — IRON,TIBC AND FERRITIN PANEL
Ferritin: 91 ng/mL (ref 30–400)
Iron Saturation: 33 % (ref 15–55)
Iron: 112 ug/dL (ref 38–169)
Total Iron Binding Capacity: 337 ug/dL (ref 250–450)
UIBC: 225 ug/dL (ref 111–343)

## 2023-03-23 LAB — VITAMIN B1: Thiamine: 133.2 nmol/L (ref 66.5–200.0)

## 2023-03-23 LAB — VITAMIN D 25 HYDROXY (VIT D DEFICIENCY, FRACTURES): Vit D, 25-Hydroxy: 64.3 ng/mL (ref 30.0–100.0)

## 2023-03-24 ENCOUNTER — Ambulatory Visit: Payer: 59 | Attending: Family Medicine | Admitting: Rehabilitative and Restorative Service Providers"

## 2023-03-24 ENCOUNTER — Encounter: Payer: Self-pay | Admitting: Family Medicine

## 2023-03-24 ENCOUNTER — Encounter: Payer: Self-pay | Admitting: Rehabilitative and Restorative Service Providers"

## 2023-03-24 DIAGNOSIS — R2681 Unsteadiness on feet: Secondary | ICD-10-CM | POA: Diagnosis not present

## 2023-03-24 DIAGNOSIS — H8110 Benign paroxysmal vertigo, unspecified ear: Secondary | ICD-10-CM | POA: Diagnosis not present

## 2023-03-24 DIAGNOSIS — R42 Dizziness and giddiness: Secondary | ICD-10-CM
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
## 2023-03-24 NOTE — Progress Notes (Signed)
Blood count and mineral look good.

## 2023-03-25 ENCOUNTER — Telehealth: Payer: Self-pay | Admitting: Rehabilitative and Restorative Service Providers"

## 2023-03-25 ENCOUNTER — Encounter: Payer: Self-pay | Admitting: Rehabilitative and Restorative Service Providers"

## 2023-03-25 DIAGNOSIS — G4733 Obstructive sleep apnea (adult) (pediatric): Secondary | ICD-10-CM | POA: Diagnosis not present

## 2023-03-25 DIAGNOSIS — F410 Panic disorder [episodic paroxysmal anxiety] without agoraphobia: Secondary | ICD-10-CM

## 2023-03-25 NOTE — Telephone Encounter (Signed)
Dr. Linford Arnold, I spoke with Bethann Berkshire during our vestibular eval yesterday about the possibility of seeing a counselor to help with strategies to manage anxiety, as these symptoms may be contributing to his clinical presentation for vestibular symptoms.  He was agreeable to have a referral if that is appropriate for him. Thank you, Nekoda Chock, PT

## 2023-03-25 NOTE — Telephone Encounter (Signed)
Orders Placed This Encounter  Procedures   Ambulatory referral to Behavioral Health    Referral Priority:   Routine    Referral Type:   Psychiatric    Referral Reason:   Specialty Services Required    Requested Specialty:   Behavioral Health    Number of Visits Requested:   1   Thank you Trula Ore!!!!!!!

## 2023-03-31 ENCOUNTER — Ambulatory Visit: Payer: 59 | Admitting: Rehabilitative and Restorative Service Providers"

## 2023-03-31 ENCOUNTER — Encounter: Payer: Self-pay | Admitting: Rehabilitative and Restorative Service Providers"

## 2023-03-31 DIAGNOSIS — R42 Dizziness and giddiness: Secondary | ICD-10-CM

## 2023-03-31 DIAGNOSIS — R2681 Unsteadiness on feet: Secondary | ICD-10-CM | POA: Diagnosis not present

## 2023-03-31 DIAGNOSIS — H8110 Benign paroxysmal vertigo, unspecified ear: Secondary | ICD-10-CM | POA: Diagnosis not present
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
## 2023-04-07 ENCOUNTER — Ambulatory Visit: Payer: 59 | Admitting: Rehabilitative and Restorative Service Providers"

## 2023-04-07 ENCOUNTER — Encounter: Payer: Self-pay | Admitting: Family Medicine

## 2023-04-07 ENCOUNTER — Telehealth: Payer: Self-pay | Admitting: Rehabilitative and Restorative Service Providers"

## 2023-04-07 ENCOUNTER — Telehealth: Payer: Self-pay | Admitting: Family Medicine

## 2023-04-07 ENCOUNTER — Ambulatory Visit (INDEPENDENT_AMBULATORY_CARE_PROVIDER_SITE_OTHER): Payer: 59 | Admitting: Family Medicine

## 2023-04-07 VITALS — BP 130/67 | HR 70 | Ht 72.0 in | Wt 190.0 lb

## 2023-04-07 DIAGNOSIS — G4733 Obstructive sleep apnea (adult) (pediatric): Secondary | ICD-10-CM | POA: Insufficient documentation

## 2023-04-07 DIAGNOSIS — G4752 REM sleep behavior disorder: Secondary | ICD-10-CM

## 2023-04-07 DIAGNOSIS — F411 Generalized anxiety disorder: Secondary | ICD-10-CM | POA: Diagnosis not present

## 2023-04-07 DIAGNOSIS — H818X9 Other disorders of vestibular function, unspecified ear: Secondary | ICD-10-CM | POA: Insufficient documentation

## 2023-04-07 DIAGNOSIS — I10 Essential (primary) hypertension: Secondary | ICD-10-CM

## 2023-04-07 NOTE — Assessment & Plan Note (Signed)
Still like him to address this with neurology in the future.  He is very fearful of getting placed on an SSRI because of potential worsening of his RBD symptoms.  But I do think in the long run it would really help better control his anxiety

## 2023-04-07 NOTE — Telephone Encounter (Signed)
PT returned call to patient:  The patient notes he has had 3 bad days of symptoms.  He got an airex foam pad for home and is practicing his HEP.  He went out of town Sunday and stayed in a hotel (did not take cpap machine). 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 He almost passed out and got visual changes. He used meditations and deep breathing (from HEP) to settle symptoms. 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   When he got up Monday, he felt dizzy in bed upon waking. He has never felt it immediately upon waking. He couldn't do anything Monday.  Tuesday he felt like he got a "full blown panic attack" with hands sweating. He took meds yesterday to help. He did meditation video and symptoms stopped after 20 minutes.   He cancelled today b/c he didn't think he could drive. He also notes his neck is tight.  He reports forehead pressure, eyes get heavy, and a sensation of being "high". Legs also get a heaviness. He denies true HA during these episodes.

## 2023-04-07 NOTE — Assessment & Plan Note (Signed)
Did let me know that he moved his clonazepam to the mornings he usually just takes a half of a tab and he has been taking a CBD oil at night to help him rest and sleep he feels like this has been working well for him.  He has not tolerated SSRIs in the past.

## 2023-04-07 NOTE — Assessment & Plan Note (Signed)
Pressure looks good today.  No indication of hypotension or uncontrolled blood pressure.

## 2023-04-07 NOTE — Telephone Encounter (Signed)
You check on his behavioral health referral?  He says he has not heard from anyone so not sure if maybe they have tried to reach out to him.

## 2023-04-07 NOTE — Assessment & Plan Note (Signed)
He follows with Dr. Jerre Simon.  Really encouraged him to get back in with him to make slight adjustments to his CPAP regimen and to make sure that it is working properly he is personally worried that his oxygen levels are still potentially dropping at night his wearable says that he dropped sometimes into the low 90s we did discuss that significant desaturation is typically 88 or less but again recommend that he get back in with Dr. Jerre Simon so that they can make sure that everything is working well

## 2023-04-07 NOTE — Progress Notes (Addendum)
Established Patient Office Visit  Subjective   Patient ID: Clinton Cox, male    DOB: 1965/07/23  Age: 57 y.o. MRN: [id number]  Chief Complaint  Patient presents with   Medical Management of Chronic Issues    HPI  Here for follow-up of vertigo sensation.  He has been working with our physical therapist Margretta Ditty who felt like his persistent vertigo sensations are most likely related to persistent postural perceptive dizziness.  So she is continue to work with him on some rehab strategies.  Also has recommended behavioral health to work with his heightened anxiety which has really been problematic and also furthering his treatment course.  We did place referral to behavioral health but he says he has not heard back from their office yet.  He does have a neurology consultation tomorrow.  He just feels off he feels like at times he gets a lot of pressure in his forehead he feels like his head feels heavy.  He actually happened an episode on Sunday where they were driving to [facility] and had pulled up to the building and felt like he was vision was going in and out like he was going to pass out.  He employed his breathing techniques and started to feel better.  He did start his CPAP machine about 5 to 6 months ago.  Initially it was set for AutoPap between 5 and 15 but he would wake up in the middle the night for like it was blowing him away.  He went into the machine and change the setting himself to 5-10 and did not necessarily reach out to his ordering provider which is Dr. Jerre Simon with pulmonology.  He felt like that was more comfortable and says gradually got used to the pressure and more recently increased up to 13 a few days ago and so far has tolerated that well.  He noticed that after he did bump it to 13 he actually has felt a little bit better during the daytime.    ROS    Objective:     BP 130/67   Pulse 70   Ht 6' (1.829 m)   Wt 190 lb (86.2 kg)   SpO2 100%   BMI 25.77  kg/m    Physical Exam Vitals reviewed.  Constitutional:      Appearance: Normal appearance.  HENT:     Head: Normocephalic.  Pulmonary:     Effort: Pulmonary effort is normal.  Neurological:     Mental Status: He is alert and oriented to person, place, and time.  Psychiatric:        Mood and Affect: Mood normal.        Behavior: Behavior normal.      No results found for any visits on 04/07/23.    The 10-year ASCVD risk score (Arnett DK, et al., 2019) is: 6.1%    Assessment & Plan:   Problem List Items Addressed This Visit       Cardiovascular and Mediastinum   Essential hypertension, benign    Pressure looks good today.  No indication of hypotension or uncontrolled blood pressure.        Respiratory   OSA on CPAP    He follows with Dr. Jerre Simon.  Really encouraged him to get back in with him to make slight adjustments to his CPAP regimen and to make sure that it is working properly he is personally worried that his oxygen levels are still potentially dropping at night his wearable says  that he dropped sometimes into the low 90s we did discuss that significant desaturation is typically 88 or less but again recommend that he get back in with Dr. Jerre Simon so that they can make sure that everything is working well        Other   RBD (REM behavioral disorder)    Still like him to address this with neurology in the future.  He is very fearful of getting placed on an SSRI because of potential worsening of his RBD symptoms.  But I do think in the long run it would really help better control his anxiety      Persistent postural-perceptual dizziness - Primary   GAD (generalized anxiety disorder)    Did let me know that he moved his clonazepam to the mornings he usually just takes a half of a tab and he has been taking a CBD oil at night to help him rest and sleep he feels like this has been working well for him.  He has not tolerated SSRIs in the past.       No follow-ups  on file.    Nani Gasser, MD

## 2023-04-08 ENCOUNTER — Encounter: Payer: Self-pay | Admitting: Family Medicine

## 2023-04-08 ENCOUNTER — Ambulatory Visit: Payer: 59 | Admitting: Family Medicine

## 2023-04-08 NOTE — Telephone Encounter (Signed)
Patient called to ask if he could please get a response before 3:30pm today because he will be here for another appointment beside [place] he is requesting a x-ray

## 2023-04-08 NOTE — Telephone Encounter (Signed)
Please notify pt of this and provide number for him to call.   thankyou

## 2023-04-08 NOTE — Telephone Encounter (Signed)
04/08/23-Left message on patients voicemail to contact [facility] [phone] to schedule referral appointment. [facility] scheduling team has left multiple messages on patients voicemail  to contact their office to schedule referral appointment.

## 2023-04-08 NOTE — Telephone Encounter (Signed)
See if he can come by and see [place] either before his neurology appointment or after.  I want a make sure it is not his thyroid gland that is inflamed versus his neck.

## 2023-04-08 NOTE — Telephone Encounter (Signed)
Patient scheduled.

## 2023-04-13 ENCOUNTER — Encounter: Payer: Self-pay | Admitting: Family Medicine

## 2023-04-13 ENCOUNTER — Ambulatory Visit (INDEPENDENT_AMBULATORY_CARE_PROVIDER_SITE_OTHER): Payer: 59 | Admitting: Family Medicine

## 2023-04-13 VITALS — BP 138/82 | HR 80 | Resp 18 | Ht 72.0 in | Wt 192.0 lb

## 2023-04-13 DIAGNOSIS — F411 Generalized anxiety disorder: Secondary | ICD-10-CM | POA: Diagnosis not present

## 2023-04-13 DIAGNOSIS — M542 Cervicalgia: Secondary | ICD-10-CM | POA: Diagnosis not present

## 2023-04-13 DIAGNOSIS — R002 Palpitations: Secondary | ICD-10-CM | POA: Diagnosis not present

## 2023-04-13 NOTE — Assessment & Plan Note (Signed)
We discussed today that I really think he needs to be on an SSRI/SNRI.  He is very worried about aggravating his current sleep disorder I would really like for him to consult back with the sleep medicine specialist to see if there are any medications that might be better options but I really do feel like this is driving a lot of his anxiety currently and that he would be better controlled to be on a controller.  We discussed that I really do not want him taking his Xanax 3 times a day even if it is just a half a tab 3 times daily.  In that part of the withdrawal that he is experiencing at 4:00 is because he is taking it twice a day.

## 2023-04-13 NOTE — Progress Notes (Signed)
Acute Office Visit  Subjective:     Patient ID: Clinton Cox, male    DOB: 10-Jul-1965, 57 y.o.   MRN: [id number]  Chief Complaint  Patient presents with   Follow-up    Patient states throat is feeling much better.    HPI Patient is in today for neck pain.  Patient had sent [place] a MyChart on November 21 stating that the day before the anterior part of his throat became very sore near his Adams apple he did take some Motrin.  He says it was actually most hard to swallow more with discomfort in the anterior part of his throat not in the back of his throat for most 2 days and then it gradually started to get better.  He at the time was worried that it was coming from his neck.  He did end up seeing the neurologist on Friday and they put him on an antiviral.  They wanted to make sure that some of the vestibular symptoms were a lot related to a viral illness.  He was not sure if the antiviral could have helped with his throat as well.  He never developed any other types of cold symptoms he never noticed any redness or rash over the neck or throat area.  Anxiety-he wonders if anxiety is driving some of his current symptoms that he is having particularly with the vertigo and with driving.  He has been taking half a tab of the Xanax in the morning and in the evenings and has noticed around 4:00 in the afternoon he starts feeling really anxious so he is more recently started taking an extra half a tab midday.  He was previously on fluoxetine for 14 to 15 years.  He also wonders if he could be hyperthyroid he is felt more jittery anxious, had some ear ringing.  Also reports a history of heat intolerance.  ROS      Objective:    BP 138/82   Pulse 80   Resp 18   Ht 6' (1.829 m)   Wt 192 lb 0.6 oz (87.1 kg)   SpO2 99%   BMI 26.05 kg/m     Physical Exam HENT:     Mouth/Throat:     Mouth: Mucous membranes are moist.     Pharynx: Oropharynx is clear.  Eyes:     Conjunctiva/sclera:  Conjunctivae normal.  Musculoskeletal:     Cervical back: Neck supple. No tenderness.  Lymphadenopathy:     Cervical: No cervical adenopathy.     No results found for any visits on 04/13/23.      Assessment & Plan:   Problem List Items Addressed This Visit       Other   Palpitations - Primary   Relevant Orders   TSH   GAD (generalized anxiety disorder)    We discussed today that I really think he needs to be on an SSRI/SNRI.  He is very worried about aggravating his current sleep disorder I would really like for him to consult back with the sleep medicine specialist to see if there are any medications that might be better options but I really do feel like this is driving a lot of his anxiety currently and that he would be better controlled to be on a controller.  We discussed that I really do not want him taking his Xanax 3 times a day even if it is just a half a tab 3 times daily.  In that part of the  withdrawal that he is experiencing at 4:00 is because he is taking it twice a day.      Other Visit Diagnoses     Neck pain           Neck pain  - has just gradually improved on its own and it does not really sound like it was in the back of his throat like a typical cold or viral illness.  But it does seem to be better I did not palpate any abnormalities on exam today.  Plan to evaluate for hyperthyroidism-will check a TSH.  The one in July looked great.  No orders of the defined types were placed in this encounter.   Return if symptoms worsen or fail to improve.  Nani Gasser, MD

## 2023-04-14 ENCOUNTER — Encounter: Payer: Self-pay | Admitting: Family Medicine

## 2023-04-14 LAB — TSH: TSH: 3.02 u[IU]/mL (ref 0.450–4.500)

## 2023-04-14 NOTE — Progress Notes (Signed)
Thyroid looks great.

## 2023-04-20 ENCOUNTER — Telehealth: Payer: Self-pay | Admitting: Family Medicine

## 2023-04-20 NOTE — Telephone Encounter (Signed)
Can we call and check on [facility] referral to [facility]

## 2023-04-21 ENCOUNTER — Ambulatory Visit: Payer: 59 | Attending: Family Medicine | Admitting: Rehabilitative and Restorative Service Providers"

## 2023-04-21 ENCOUNTER — Encounter: Payer: Self-pay | Admitting: Rehabilitative and Restorative Service Providers"

## 2023-04-21 DIAGNOSIS — R42 Dizziness and giddiness: Secondary | ICD-10-CM | POA: Insufficient documentation

## 2023-04-21 DIAGNOSIS — R2681 Unsteadiness on feet: Secondary | ICD-10-CM | POA: Insufficient documentation
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
## 2023-04-22 ENCOUNTER — Other Ambulatory Visit: Payer: Self-pay | Admitting: Family Medicine

## 2023-04-22 DIAGNOSIS — F411 Generalized anxiety disorder: Secondary | ICD-10-CM

## 2023-04-22 NOTE — Telephone Encounter (Signed)
Patient has an appointment scheduled for 06/03/2023 at 12:00pm with Clinton Cox. Pt aware of appointment.

## 2023-04-24 DIAGNOSIS — G4733 Obstructive sleep apnea (adult) (pediatric): Secondary | ICD-10-CM | POA: Diagnosis not present

## 2023-04-26 ENCOUNTER — Telehealth: Payer: Self-pay | Admitting: Family Medicine

## 2023-04-26 DIAGNOSIS — F411 Generalized anxiety disorder: Secondary | ICD-10-CM

## 2023-04-26 NOTE — Telephone Encounter (Signed)
Copied from CRM [id number]. Topic: Clinical - Medication Refill >> Apr 26, 2023  1:38 PM Maxwell Marion wrote: Most Recent Primary Care Visit:  Provider: Nani Gasser D  Department: [facility]  Visit Type: OFFICE VISIT  Date: 04/13/2023  Medication: clonazePAM (KLONOPIN) 0.5 MG tablet  Has the patient contacted their pharmacy? Yes (Agent: If no, request that the patient contact the pharmacy for the refill. If patient does not wish to contact the pharmacy document the reason why and proceed with request.) (Agent: If yes, when and what did the pharmacy advise?)  Is this the correct pharmacy for this prescription? Yes If no, delete pharmacy and type the correct one.  This is the patient's preferred pharmacy:  [id number]za Evangelist, [place] - [address] [place] [address] Phone: [phone] Fax: [phone]   Has the prescription been filled recently?   Is the patient out of the medication?   Has the patient been seen for an appointment in the last year OR does the patient have an upcoming appointment?   Can we respond through MyChart?   Agent: Please be advised that Rx refills may take up to 3 business days. We ask that you follow-up with your pharmacy.

## 2023-04-27 ENCOUNTER — Other Ambulatory Visit: Payer: Self-pay

## 2023-04-28 ENCOUNTER — Ambulatory Visit: Payer: 59 | Admitting: Rehabilitative and Restorative Service Providers"

## 2023-04-28 MED ORDER — CLONAZEPAM 0.5 MG PO TABS: 0.5000 mg | ORAL_TABLET | Freq: Every day | ORAL | 0 refills | Status: DC | PRN

## 2023-04-28 NOTE — Telephone Encounter (Signed)
Meds sent  Meds ordered this encounter  Medications   clonazePAM (KLONOPIN) 0.5 MG tablet    Sig: Take 1 tablet (0.5 mg total) by mouth daily as needed for anxiety.    Dispense:  90 tablet    Refill:  0

## 2023-04-28 NOTE — Telephone Encounter (Signed)
Patient called about mediction

## 2023-04-30 ENCOUNTER — Telehealth: Payer: Self-pay

## 2023-04-30 NOTE — Telephone Encounter (Signed)
Copied from CRM [id number]. Topic: Clinical - Medication Refill >> Apr 28, 2023 11:33 AM Fuller Mandril wrote: Most Recent Primary Care Visit:  Provider: Nani Gasser D  Department: [facility]  Visit Type: OFFICE VISIT  Date: 04/13/2023  Medication: duplicate request pt called back in for clonazePAM (KLONOPIN) 0.5 MG tablet - discontinued 11/26 per notes. Pt states he did not request to stop medication. He needs 1 per day for sleep. Contacted [facility].   Has the patient contacted their pharmacy? Yes - pharmacy has attempted to request twice - no repsonse (Agent: If no, request that the patient contact the pharmacy for the refill. If patient does not wish to contact the pharmacy document the reason why and proceed with request.) (Agent: If yes, when and what did the pharmacy advise?)  Is this the correct pharmacy for this prescription? Yes If no, delete pharmacy and type the correct one.  This is the patient's preferred pharmacy:  [id number] [address]za Evangel[address], [place] - [address] [place] [address] Phone: [phone] Fax: [phone]   Has the prescription been filled recently? No  Is the patient out of the medication? Yes  Has the patient been seen for an appointment in the last year OR does the patient have an upcoming appointment? Yes  Can we respond through MyChart? No  Agent: Please be advised that Rx refills may take up to 3 business days. We ask that you follow-up with your pharmacy.

## 2023-04-30 NOTE — Telephone Encounter (Signed)
Prescription refill of klonopin sent yesterday to pharmacy.

## 2023-05-05 ENCOUNTER — Encounter: Payer: Self-pay | Admitting: Rehabilitative and Restorative Service Providers"

## 2023-05-05 ENCOUNTER — Ambulatory Visit: Payer: 59 | Admitting: Rehabilitative and Restorative Service Providers"

## 2023-05-05 DIAGNOSIS — R2681 Unsteadiness on feet: Secondary | ICD-10-CM

## 2023-05-05 DIAGNOSIS — R42 Dizziness and giddiness: Secondary | ICD-10-CM | POA: Diagnosis not present
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
## 2023-05-06 ENCOUNTER — Encounter: Payer: Self-pay | Admitting: Rehabilitative and Restorative Service Providers"

## 2023-05-11 DIAGNOSIS — G629 Polyneuropathy, unspecified: Secondary | ICD-10-CM | POA: Diagnosis not present

## 2023-05-13 NOTE — Telephone Encounter (Signed)
Can you check on Behav referral.  ? I can't tell what happened with it.  It was placed in early Nov

## 2023-05-14 NOTE — Telephone Encounter (Signed)
Clinton Cox has been scheduled for 06/03/2023 at 12 pm with [facility].

## 2023-06-03 ENCOUNTER — Ambulatory Visit: Payer: Self-pay | Admitting: Professional

## 2023-06-03 ENCOUNTER — Encounter: Payer: Self-pay | Admitting: Professional

## 2023-06-03 DIAGNOSIS — F411 Generalized anxiety disorder: Secondary | ICD-10-CM
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
## 2023-06-03 NOTE — Progress Notes (Signed)
° ° ° ° ° ° ° ° ° ° ° ° ° ° °  Araya Roel, LCMHC °

## 2023-06-09 ENCOUNTER — Encounter: Payer: 59 | Admitting: Rehabilitative and Restorative Service Providers"

## 2023-06-16 ENCOUNTER — Ambulatory Visit: Payer: Self-pay | Admitting: Professional

## 2023-07-09 ENCOUNTER — Ambulatory Visit: Payer: Self-pay | Admitting: Professional

## 2023-07-22 ENCOUNTER — Ambulatory Visit: Payer: Self-pay | Admitting: Professional

## 2023-08-13 ENCOUNTER — Encounter: Payer: Self-pay | Admitting: Sports Medicine

## 2023-08-13 ENCOUNTER — Ambulatory Visit

## 2023-08-13 ENCOUNTER — Ambulatory Visit (INDEPENDENT_AMBULATORY_CARE_PROVIDER_SITE_OTHER): Payer: Self-pay | Admitting: Sports Medicine

## 2023-08-13 DIAGNOSIS — M25551 Pain in right hip: Secondary | ICD-10-CM

## 2023-08-13 DIAGNOSIS — M7541 Impingement syndrome of right shoulder: Secondary | ICD-10-CM

## 2023-08-13 DIAGNOSIS — M160 Bilateral primary osteoarthritis of hip: Secondary | ICD-10-CM | POA: Insufficient documentation

## 2023-08-13 DIAGNOSIS — M6281 Muscle weakness (generalized): Secondary | ICD-10-CM | POA: Diagnosis not present

## 2023-08-13 DIAGNOSIS — M25511 Pain in right shoulder: Secondary | ICD-10-CM

## 2023-08-13 MED ORDER — MELOXICAM 15 MG PO TABS: ORAL_TABLET | ORAL | 3 refills | Status: DC

## 2023-08-13 NOTE — Assessment & Plan Note (Signed)
 Several weeks increasing pain right shoulder localized over the deltoid worse with abduction, positive impingement signs on exam. We discussed the anatomy and pathophysiology of rotator cuff disease, adding x-rays, Mobic, formal PT, return to see me in 6 weeks, subacromial injection if not better.

## 2023-08-13 NOTE — Progress Notes (Signed)
    Procedures performed today:    None.  Independent interpretation of notes and tests performed by another provider:   None.  Brief History, Exam, Impression, and Recommendations:    Impingement syndrome, shoulder, right Several weeks increasing pain right shoulder localized over the deltoid worse with abduction, positive impingement signs on exam. We discussed the anatomy and pathophysiology of rotator cuff disease, adding x-rays, Mobic, formal PT, return to see me in 6 weeks, subacromial injection if not better.  Right hip pain Also endorsing discomfort right anterior thigh, some weakness. On exam his hip motion is very good, no pain with internal rotation, negative FADIR and FABER signs, he does have significant hip abductor weakness. We will add some hip abductor conditioning and he can return to see me in 6 weeks for this.  Muscle weakness Adis does have subjective muscle weakness in the lower extremities. He has had some major stressors including the loss of his son last year to fentanyl. With the exception of hip abductor weakness his lower extremity strength and motion is good. He also has noted some change in sensation in his hands, he describes it as a cold sensation, almost described as Raynaud's. He tells me his hands feel cold today though they do feel warm with excellent capillary refill. I would like him to touch base with neurology to discuss the weakness, he may need nerve conduction and EMG to ensure were not dealing with a radicular component.    ____________________________________________ Clinton Cox. Benjamin Stain, M.D., ABFM., CAQSM., AME. Primary Care and Sports Medicine [facility]  Adjunct Professor of Family Medicine  [place] [employer]  Restaurant manager, fast food

## 2023-08-13 NOTE — Assessment & Plan Note (Signed)
 Also endorsing discomfort right anterior thigh, some weakness. On exam his hip motion is very good, no pain with internal rotation, negative FADIR and FABER signs, he does have significant hip abductor weakness. We will add some hip abductor conditioning and he can return to see me in 6 weeks for this.

## 2023-08-13 NOTE — Assessment & Plan Note (Addendum)
 Clinton Cox does have subjective muscle weakness in the lower extremities. He has had some major stressors including the loss of his son last year to fentanyl. With the exception of hip abductor weakness his lower extremity strength and motion is good. He also has noted some change in sensation in his hands, he describes it as a cold sensation, almost described as Raynaud's. He tells me his hands feel cold today though they do feel warm with excellent capillary refill. I would like him to touch base with neurology to discuss the weakness, he may need nerve conduction and EMG to ensure were not dealing with a radicular component.

## 2023-08-18 ENCOUNTER — Encounter: Payer: Self-pay | Admitting: Sports Medicine

## 2023-08-19 ENCOUNTER — Encounter: Payer: Self-pay | Admitting: Neurology

## 2023-09-24 ENCOUNTER — Ambulatory Visit (INDEPENDENT_AMBULATORY_CARE_PROVIDER_SITE_OTHER): Admitting: Sports Medicine

## 2023-09-24 DIAGNOSIS — I7300 Raynaud's syndrome without gangrene: Secondary | ICD-10-CM | POA: Diagnosis not present

## 2023-09-24 DIAGNOSIS — M7541 Impingement syndrome of right shoulder: Secondary | ICD-10-CM | POA: Diagnosis not present

## 2023-09-24 DIAGNOSIS — M160 Bilateral primary osteoarthritis of hip: Secondary | ICD-10-CM

## 2023-09-24 NOTE — Assessment & Plan Note (Signed)
 Increasing shoulder pain in the past, rotator cuff signs, we had him do some home conditioning, meloxicam , things improved, I have encouraged him to do all of the exercises on the handout rather than 1 or 2, if persistent discomfort after another 4 to 6 weeks we will consider putting him into formal PT versus subacromial injections.

## 2023-09-24 NOTE — Progress Notes (Signed)
    Procedures performed today:    None.  Independent interpretation of notes and tests performed by another provider:   None.  Brief History, Exam, Impression, and Recommendations:    Impingement syndrome, shoulder, right Increasing shoulder pain in the past, rotator cuff signs, we had him do some home conditioning, meloxicam , things improved, I have encouraged him to do all of the exercises on the handout rather than 1 or 2, if persistent discomfort after another 4 to 6 weeks we will consider putting him into formal PT versus subacromial injections.  Primary osteoarthritis of both hips Right anterior thigh pain initially, he had some hip abductor weakness. X-rays did show hip osteoarthritis, pain has localized to the groin and clear to self his hip joint, this improved considerably with home PT and meloxicam .   Raynaud phenomenon Also endorsing a constellation of symptoms including mental fog, various fears. He does endorse color change in his extremities, he brought a picture of his foot, it looked like the midfoot distally was blanched as can be seen in Raynaud's phenomenon. He is currently take amlodipine , I have suggested discussing this further with his PCP, I have suggested going up to 7.5 mg and wearing compression stockings if the edema was intolerable. He is probably good candidate for some rheumatoid testing, though he does have only Raynaud's phenomenon, he does not have any of the other symptoms we typically see with crest syndrome, it is reasonable to pull the trigger for rheumatoid type workup, I will leave this up to he and his PCP. He also had some labs done at [facility], I explained that often times these labs would reveal abnormalities that were not entirely clinically relevant. His reverse T3 was elevated, and I explained that this also was not entirely clinically relevant but it was reasonable to recheck his TFTs with his PCP. He also had some  questions about a tremor, there was no rest tremor, he had no masklike facies, his movements are normal, he has no rigidity, the tremor was predominantly intention suggestive of a benign essential tremor, I reassured him of this.  I spent 40 minutes of total time managing this patient today, this includes chart review, face to face, and non-face to face time.  ____________________________________________ Joselyn Nicely. Sandy Crumb, M.D., ABFM., CAQSM., AME. Primary Care and Sports Medicine [facility]  Adjunct Professor of [facility]  [employer]  Restaurant manager, fast food

## 2023-09-24 NOTE — Assessment & Plan Note (Signed)
 Also endorsing a constellation of symptoms including mental fog, various fears. He does endorse color change in his extremities, he brought a picture of his foot, it looked like the midfoot distally was blanched as can be seen in Raynaud's phenomenon. He is currently take amlodipine , I have suggested discussing this further with his PCP, I have suggested going up to 7.5 mg and wearing compression stockings if the edema was intolerable. He is probably good candidate for some rheumatoid testing, though he does have only Raynaud's phenomenon, he does not have any of the other symptoms we typically see with crest syndrome, it is reasonable to pull the trigger for rheumatoid type workup, I will leave this up to he and his PCP. He also had some labs done at [facility], I explained that often times these labs would reveal abnormalities that were not entirely clinically relevant. His reverse T3 was elevated, and I explained that this also was not entirely clinically relevant but it was reasonable to recheck his TFTs with his PCP. He also had some questions about a tremor, there was no rest tremor, he had no masklike facies, his movements are normal, he has no rigidity, the tremor was predominantly intention suggestive of a benign essential tremor, I reassured him of this.

## 2023-09-24 NOTE — Assessment & Plan Note (Signed)
 Right anterior thigh pain initially, he had some hip abductor weakness. X-rays did show hip osteoarthritis, pain has localized to the groin and clear to self his hip joint, this improved considerably with home PT and meloxicam .

## 2023-09-30 ENCOUNTER — Encounter: Payer: Self-pay | Admitting: Family Medicine

## 2023-09-30 ENCOUNTER — Ambulatory Visit (INDEPENDENT_AMBULATORY_CARE_PROVIDER_SITE_OTHER): Admitting: Family Medicine

## 2023-09-30 VITALS — BP 127/80 | HR 64 | Ht 72.0 in | Wt 193.1 lb

## 2023-09-30 DIAGNOSIS — M2550 Pain in unspecified joint: Secondary | ICD-10-CM | POA: Diagnosis not present

## 2023-09-30 DIAGNOSIS — R7989 Other specified abnormal findings of blood chemistry: Secondary | ICD-10-CM | POA: Diagnosis not present

## 2023-09-30 NOTE — Progress Notes (Signed)
 Established Patient Office Visit  Subjective  Patient ID: Clinton Cox, male    DOB: [birth year]  Age: 58 y.o. MRN: [id number]  Chief Complaint  Patient presents with   Medical Management of Chronic Issues    HPI  Here to discuss thyroid  and joint pain concerns.  He has been seeing sports med and they recommended he follow back up.  He has had a couple different things going on.  More recently he has been having a lot of pain particularly in his shoulders and hips.  He denies any known joint swelling but says that just painful particularly with activity he denies any significant morning stiffness fevers or chills or night sweats.  No abnormal weight loss.  He does get occasional coldness in hands and feet and the sensation of cold though sometimes when people touch it they say it does not feel cold.  But he has noticed and even taken pictures of his feet for example where his distal foot looks white and then his midfoot looks pink or even red.  He still not sleeping well in fact the other night he says that his mind just raced for couple hours and really his thoughts were just flooded and some of it was negative.  He does have his upcoming appoint with the sleep specialist in about 2 weeks it took a while to get an appointment.  He still struggles occasionally with brain fog some days are better than others.  He also wanted to let me know that he did go to [facility] and had some blood work there.  They did put him on a couple of supplements for brain health and also gave him a supplement to help decrease his estrogen levels because he was having some more chest discomfort.      ROS    Objective:     BP 127/80   Pulse 64   Ht 6' (1.829 m)   Wt 193 lb 1.3 oz (87.6 kg)   SpO2 100%   BMI 26.19 kg/m    Physical Exam Vitals and nursing note reviewed.  Constitutional:      Appearance: Normal appearance.  HENT:     Head: Normocephalic and atraumatic.  Eyes:      Conjunctiva/sclera: Conjunctivae normal.  Cardiovascular:     Rate and Rhythm: Normal rate and regular rhythm.  Pulmonary:     Effort: Pulmonary effort is normal.     Breath sounds: Normal breath sounds.  Skin:    General: Skin is warm and dry.  Neurological:     Mental Status: He is alert.  Psychiatric:        Mood and Affect: Mood normal.      No results found for any visits on 09/30/23.    The 10-year ASCVD risk score (Arnett DK, et al., 2019) is: 6.5%    Assessment & Plan:   Problem List Items Addressed This Visit   None Visit Diagnoses       Abnormal thyroid  stimulating hormone level    -  Primary   Relevant Orders   T3, reverse   TSH + free T4   ANA   Rheumatoid factor   Cyclic citrul peptide antibody, IgG   Sedimentation rate   C-reactive protein   Uric acid     Polyarthralgia       Relevant Orders   T3, reverse   TSH + free T4   ANA   Rheumatoid factor   Cyclic citrul peptide  antibody, IgG   Sedimentation rate   C-reactive protein   Uric acid      He did have an abnormal reverse T3 on his labs that were elevated that were done at [facility] will start by rechecking that.  And seeing if it still off.  Polyarthralgia-he does not have any significant morning stiffness or swelling in the joints so autoimmune is less likely but we will do further workup and panel today for further evaluation will call with results once available  I am really excited that he has the consult with the sleep specialist in about 2 weeks.  Return if symptoms worsen or fail to improve.    Duaine German, MD

## 2023-10-01 ENCOUNTER — Encounter: Payer: Self-pay | Admitting: Family Medicine

## 2023-10-01 ENCOUNTER — Ambulatory Visit: Payer: Self-pay | Admitting: Family Medicine

## 2023-10-01 NOTE — Progress Notes (Signed)
 Hi Verdon so far the ANA is negative that the test for lupus the rheumatoid labs are also negative for rheumatoid arthritis.  Inflammatory markers are also negative which also tells us  that it is unlikely to be an autoimmune disorder causing your arthritis versus just wear and tear.  Uric acid looks great so very unlikely to be gout.  TSH and free T4 look great so still awaiting for that 1 additional lab.

## 2023-10-01 NOTE — Progress Notes (Signed)
 Correct, no graves disease.  Have a good weekend.

## 2023-10-07 LAB — RHEUMATOID FACTOR: Rheumatoid fact SerPl-aCnc: <10 [IU]/mL (ref <14.0)

## 2023-10-07 LAB — T3, REVERSE: Reverse T3, Serum: 20.4 ng/dL (ref 9.2–24.1)

## 2023-10-07 LAB — C-REACTIVE PROTEIN: CRP: <1 mg/L (ref 0–10)

## 2023-10-07 LAB — TSH+FREE T4
Free T4: 1.28 ng/dL (ref 0.82–1.77)
TSH: 3.61 u[IU]/mL (ref 0.450–4.500)

## 2023-10-07 LAB — SEDIMENTATION RATE: Sed Rate: 2 mm/h (ref 0–30)

## 2023-10-07 LAB — URIC ACID: Uric Acid: 5.2 mg/dL (ref 3.8–8.4)

## 2023-10-07 LAB — ANA: Anti Nuclear Antibody (ANA): NEGATIVE
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
## 2023-10-18 ENCOUNTER — Encounter: Payer: Self-pay | Admitting: Family Medicine

## 2023-10-18 DIAGNOSIS — R209 Unspecified disturbances of skin sensation: Secondary | ICD-10-CM

## 2023-10-18 DIAGNOSIS — M542 Cervicalgia: Secondary | ICD-10-CM

## 2023-10-20 ENCOUNTER — Encounter: Payer: Self-pay | Admitting: Neurology

## 2023-10-20 ENCOUNTER — Ambulatory Visit: Admitting: Neurology

## 2023-10-20 VITALS — BP 147/84 | HR 82 | Ht 72.0 in | Wt 188.0 lb

## 2023-10-20 DIAGNOSIS — M1611 Unilateral primary osteoarthritis, right hip: Secondary | ICD-10-CM

## 2023-10-20 DIAGNOSIS — R4189 Other symptoms and signs involving cognitive functions and awareness: Secondary | ICD-10-CM

## 2023-10-20 DIAGNOSIS — G4752 REM sleep behavior disorder: Secondary | ICD-10-CM | POA: Diagnosis not present

## 2023-10-20 DIAGNOSIS — F32A Depression, unspecified: Secondary | ICD-10-CM

## 2023-10-20 DIAGNOSIS — F411 Generalized anxiety disorder: Secondary | ICD-10-CM | POA: Diagnosis not present

## 2023-10-20 NOTE — Addendum Note (Signed)
 Addended by: Arturo Bing on: 10/20/2023 04:21 PM   Modules accepted: Orders

## 2023-10-20 NOTE — Patient Instructions (Signed)
 I saw you today for brain fog, your concern for parkinsonism, and potential leg weakness.  Your neurologic examination is normal. I see no evidence of parkinsonism or weakness.  I recommend you follow up at [facility] with sleep medicine as planned.  I am referring you to psychiatry for your anxiety and depression as this is likely what is driving your brain fog.  Return to clinic as needed.  Please let me know if you have any questions or concerns in the meantime.  The physicians and staff at [facility] are committed to providing excellent care. You may receive a survey requesting feedback about your experience at our office. We strive to receive "very good" responses to the survey questions. If you feel that your experience would prevent you from giving the office a "very good " response, please contact our office to try to remedy the situation. We may be reached at [phone]. Thank you for taking the time out of your busy day to complete the survey.  Rommie Coats, MD [facility]

## 2023-10-28 ENCOUNTER — Ambulatory Visit: Admitting: Family Medicine

## 2023-10-29 ENCOUNTER — Telehealth: Payer: Self-pay | Admitting: Neurology

## 2023-10-29 ENCOUNTER — Other Ambulatory Visit: Payer: Self-pay

## 2023-10-29 DIAGNOSIS — F410 Panic disorder [episodic paroxysmal anxiety] without agoraphobia: Secondary | ICD-10-CM

## 2023-10-29 DIAGNOSIS — Z789 Other specified health status: Secondary | ICD-10-CM

## 2023-10-29 DIAGNOSIS — G4752 REM sleep behavior disorder: Secondary | ICD-10-CM

## 2023-10-29 DIAGNOSIS — R5383 Other fatigue: Secondary | ICD-10-CM

## 2023-10-29 NOTE — Telephone Encounter (Signed)
 Pt is calling back to let us  know that we were going to refer him to a Psychiatry in [place] and he has not heard anything. He wants to check the status

## 2023-10-29 NOTE — Telephone Encounter (Signed)
 Pt called to let Dr.Hill know he has not heard from anyone regarding a referral to a psychiatrist. He is following up on it cause Dr.Hill said it may get lost

## 2023-10-29 NOTE — Telephone Encounter (Signed)
 Called pt and made him aware I sent order to [facility] and I changed it to [place]. I will call on the next day I am here to see if they received the order. He understood,

## 2023-11-01 MED ORDER — BUSPIRONE HCL 5 MG PO TABS: 5.0000 mg | ORAL_TABLET | Freq: Three times a day (TID) | ORAL | 0 refills | Status: DC

## 2023-11-01 NOTE — Telephone Encounter (Signed)
 Meds ordered this encounter  Medications   busPIRone (BUSPAR) 5 MG tablet    Sig: Take 1 tablet (5 mg total) by mouth 3 (three) times daily.    Dispense:  90 tablet    Refill:  0

## 2023-11-02 NOTE — Telephone Encounter (Signed)
 Called the Med [facility] and they have the referrals . Called pt and let him know and gave him the number to call any time he would like.

## 2023-11-03 ENCOUNTER — Ambulatory Visit (HOSPITAL_COMMUNITY): Admitting: Registered Nurse

## 2023-11-03 ENCOUNTER — Encounter (HOSPITAL_COMMUNITY): Payer: Self-pay | Admitting: Registered Nurse

## 2023-11-03 DIAGNOSIS — F410 Panic disorder [episodic paroxysmal anxiety] without agoraphobia: Secondary | ICD-10-CM

## 2023-11-03 DIAGNOSIS — F411 Generalized anxiety disorder: Secondary | ICD-10-CM

## 2023-11-03 DIAGNOSIS — F331 Major depressive disorder, recurrent, moderate: Secondary | ICD-10-CM | POA: Diagnosis not present

## 2023-11-03 MED ORDER — BUSPIRONE HCL 5 MG PO TABS
5.0000 mg | ORAL_TABLET | Freq: Three times a day (TID) | ORAL | Status: DC
Start: 2023-11-03 — End: 2023-12-03

## 2023-11-03 MED ORDER — CLONAZEPAM 0.5 MG PO TABS: 0.2500 mg | ORAL_TABLET | Freq: Every day | ORAL | Status: DC | PRN

## 2023-11-03 NOTE — Patient Instructions (Addendum)
 Due to its different pharmacologic profile, Bupropion/Wellbutrin has been theorized to have a lower risk of RBD in observational studies. While it may not increase the risk of RBD, there are reports of Bupropion/Wellbutrin causing vivid dreams and nightmares.  Call 911, [phone], mobile crisis, or present to the nearest emergency room should you experience any suicidal/homicidal ideation, auditory/visual/hallucinations, or detrimental worsening of your mental health.  Mobile Crisis Response Teams Listed by counties in vicinity of [facility] providers [facility] [phone] [facility] [phone] [facility] [phone] [facility] [phone] [place]                * [facility] [phone]                * [facility] [phone]  [facility] [phone] [facility] [employer]  [phone] * [facility] [phone]
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
## 2023-11-08 NOTE — Addendum Note (Signed)
 Addended by: Cassy Sprowl D on: 11/08/2023 09:34 PM   Modules accepted: Orders

## 2023-11-09 ENCOUNTER — Encounter: Payer: Self-pay | Admitting: Neurology

## 2023-11-16 ENCOUNTER — Other Ambulatory Visit: Payer: Self-pay | Admitting: Sports Medicine

## 2023-11-16 DIAGNOSIS — M7541 Impingement syndrome of right shoulder: Secondary | ICD-10-CM

## 2023-11-17 ENCOUNTER — Telehealth (HOSPITAL_COMMUNITY): Admitting: Registered Nurse

## 2023-11-17 NOTE — Telephone Encounter (Signed)
 Last read by Garen LELON [place] at 3:28PM on 11/17/2023.

## 2023-11-17 NOTE — Addendum Note (Signed)
 Addended by: BONNY JON DEL on: 11/17/2023 02:25 PM   Modules accepted: Orders

## 2023-11-17 NOTE — Telephone Encounter (Signed)
 Ok to place referral.

## 2023-11-30 ENCOUNTER — Telehealth: Payer: Self-pay

## 2023-11-30 NOTE — Telephone Encounter (Signed)
 Clinton Cox with [facility] had received a referral but last two digits of pateint DOB were missing .  Informed her that patient DOB was 07-23-1965. She will reach out to patient for scheduling.

## 2023-12-02 ENCOUNTER — Encounter: Payer: Self-pay | Admitting: Family Medicine

## 2023-12-02 DIAGNOSIS — F331 Major depressive disorder, recurrent, moderate: Secondary | ICD-10-CM

## 2023-12-02 DIAGNOSIS — F410 Panic disorder [episodic paroxysmal anxiety] without agoraphobia: Secondary | ICD-10-CM

## 2023-12-03 MED ORDER — BUSPIRONE HCL 7.5 MG PO TABS: 7.5000 mg | ORAL_TABLET | Freq: Three times a day (TID) | ORAL | 1 refills | Status: DC

## 2023-12-04 ENCOUNTER — Other Ambulatory Visit: Payer: Self-pay | Admitting: Family Medicine

## 2023-12-06 ENCOUNTER — Other Ambulatory Visit: Payer: Self-pay | Admitting: *Deleted

## 2023-12-06 DIAGNOSIS — F331 Major depressive disorder, recurrent, moderate: Secondary | ICD-10-CM

## 2023-12-06 DIAGNOSIS — F410 Panic disorder [episodic paroxysmal anxiety] without agoraphobia: Secondary | ICD-10-CM

## 2023-12-06 MED ORDER — BUSPIRONE HCL 7.5 MG PO TABS: 7.5000 mg | ORAL_TABLET | Freq: Three times a day (TID) | ORAL | 1 refills | Status: DC

## 2023-12-06 NOTE — Telephone Encounter (Signed)
 Patient called office to get his busPIRone  (BUSPAR ) 7.5 MG tablet refilled, I stated to patient that medication was sent to pharmacy on 12/03/23, please advise, thanks.

## 2023-12-07 ENCOUNTER — Telehealth: Payer: Self-pay | Admitting: Family Medicine

## 2023-12-07 NOTE — Telephone Encounter (Signed)
 Medication sent again yesterday.

## 2023-12-07 NOTE — Telephone Encounter (Unsigned)
 Copied from CRM [id number]. Topic: Referral - Question >> Dec 07, 2023 12:04 PM Farrel B wrote: Reason for CRM: [id number] Clinton Cox called from the [facility] and advised the referral mentioned vascular surgery. Clinton Cox states she is needing someone from the clinical staff/referral coordinator to call their office because they do not perform any types of surgeries in that office. Please called Ms. Clinton Cox asap

## 2023-12-07 NOTE — Telephone Encounter (Signed)
 FYI Only or Action Required?: Action required by provider: referral request. Vascular surgery Patient was last seen in primary care on 09/30/2023 by Alvan Dorothyann BIRCH, MD.  Called Nurse Triage reporting No chief complaint on file..  Symptoms began today.  Interventions attempted: Nothing.  Symptoms are: stable.  Triage Disposition: No disposition on file.  Patient/caregiver understands and will follow disposition?:       Copied from CRM #[id number]. Topic: Clinical - Medical Advice >> Dec 07, 2023  1:41 PM Zane F wrote: Reason for CRM:   Caller: Danielle  Calling From: [facility]  After discussing the patient's needs with him; They found that the patient is in need of a vascular surgeon which is not a service they offer; They informed the PAS that the patient would need to be referred to a Vascular Surgeon within [facility] for further assistance.  If there are question or additional information needed please contact their office at [phone]

## 2023-12-07 NOTE — Telephone Encounter (Signed)
 Copied from CRM [id number]. Topic: Clinical - Prescription Issue >> Dec 06, 2023  1:18 PM Merlynn A wrote: Reason for CRM: Patient called in regarding medication busPIRone  (BUSPAR ) 7.5 MG tablet. Patient is wondering where medication is and why its not sent to pharmacy as of yet. Please contact patient to discuss when medication will be sent to pharmacy. Patient states that he was advised that he needed to stay on board with medication and take medication on schedule per provider.

## 2023-12-07 NOTE — Telephone Encounter (Signed)
 Medication was sent yesterday

## 2023-12-07 NOTE — Telephone Encounter (Signed)
 Copied from CRM [id number]. Topic: Clinical - Prescription Issue >> Dec 06, 2023  9:54 AM Laurier BROCKS wrote: Reason for CRM: Patient states he spoke with his provider who said a prescription for busPIRone  (BUSPAR ) 7.5 MG tablet would be sent to [facility]. Patient states he went to pharmacy and they do not have his medication, which he's currently out of.

## 2023-12-08 NOTE — Telephone Encounter (Signed)
 Please call and get further information I actually already referred him to Dr. Haleem Hanner for like the cold hands and feet to look for vascular issues.  I do not have a note from their office so I do not know if he went or not.  We had also placed a neurology referral.  Some not sure what needing a new vascular surgery referral for?

## 2023-12-09 NOTE — Telephone Encounter (Signed)
 Okay then I would recommend that we get him set up for ABIs of his lower extremities which is the special blood pressures.  I was not aware that the referral was denied.  If he is okay with getting the ABIs scheduled to look for any vascular issues with his legs and feet then please let me know.

## 2023-12-10 NOTE — Telephone Encounter (Signed)
 Spoke with patient. He is scheduled for July 30th for back pain  and will discuss the vascular concerns with Dr. Alvan at this visit as well.

## 2023-12-15 ENCOUNTER — Ambulatory Visit

## 2023-12-15 ENCOUNTER — Encounter: Payer: Self-pay | Admitting: Family Medicine

## 2023-12-15 ENCOUNTER — Ambulatory Visit (INDEPENDENT_AMBULATORY_CARE_PROVIDER_SITE_OTHER): Admitting: Family Medicine

## 2023-12-15 VITALS — BP 139/78 | HR 71 | Ht 72.0 in | Wt 192.1 lb

## 2023-12-15 DIAGNOSIS — M549 Dorsalgia, unspecified: Secondary | ICD-10-CM | POA: Diagnosis not present

## 2023-12-15 DIAGNOSIS — F411 Generalized anxiety disorder: Secondary | ICD-10-CM | POA: Diagnosis not present

## 2023-12-15 DIAGNOSIS — I10 Essential (primary) hypertension: Secondary | ICD-10-CM | POA: Diagnosis not present

## 2023-12-15 NOTE — Assessment & Plan Note (Addendum)
 Recently started the 7.5 mg on the buspirone  3 times a day.  The last couple days he has been tolerating it well.  He is having some rebound sleep issues with the clonazepam  waking up at 1 AM then 3 AM and then 5 AM though it does help him fall asleep.  Did discuss tapering down by maybe alternating between a whole tab and a half a tab for about 3 weeks and then doing 2 nights at a half tab and then the third night a whole tab and then going down to half a tab nightly.  And then we can taper from there.

## 2023-12-15 NOTE — Progress Notes (Signed)
 Established Patient Office Visit  Subjective  Patient ID: Clinton Cox, male    DOB: 21-May-1965  Age: 58 y.o. MRN: [id number]  No chief complaint on file.   HPI  C/o of left sided upper back pain - no injury or trauma.  Area discomfort is not directly over the spine but just a little to the left almost at the base of the shoulder blade or between the shoulder blade and the spine.  The other day was actually radiating around to his left side and even towards the lateral abdomen.  He says if he twists at his low back he can feel that discomfort he said he for started to notice that when he was doing his neck stretches and when he would push his neck and chin backwards he would feel pressure in that area but then it would go away but now the pain has been persistent.  He is getting ready to travel soon so just wanted to make sure that it was okay  He did bump up his buspirone  7 to 7.5 mg about 4 to 5 days ago.  The first 2 nights he actually had a panic attack the last 2 nights he is actually done really well with that.  When he gets the panic attacks sensation he gets an increased heart rate and his hands start to tingle  He is also concerned because he says that when he gets excited like watching a sport or show which she is done for 30 years it is almost like he still feels hyper excited for even hours afterwards.  Exam was like he cannot calm down.    ROS    Objective:     BP 139/78   Pulse 71   Ht 6' (1.829 m)   Wt 192 lb 1.9 oz (87.1 kg)   SpO2 99%   BMI 26.06 kg/m    Physical Exam Vitals reviewed.  Constitutional:      Appearance: Normal appearance.  HENT:     Head: Normocephalic.  Pulmonary:     Effort: Pulmonary effort is normal.  Musculoskeletal:       Back:     Comments: The red area on the diagram is his area where he reports pain and discomfort.  The green area we were able to she will palpate tenderness directly along the spine.  Neck with normal range of  motion and shoulders with normal range of motion and strength 5 out of 5 bilaterally.  Neurological:     Mental Status: He is alert and oriented to person, place, and time.  Psychiatric:        Mood and Affect: Mood normal.        Behavior: Behavior normal.      No results found for any visits on 12/15/23.    The 10-year ASCVD risk score (Arnett DK, et al., 2019) is: 7.5%    Assessment & Plan:   Problem List Items Addressed This Visit       Cardiovascular and Mediastinum   Essential hypertension, benign - Primary   Pressure little borderline elevated today.  Didn't take his amlodipine  this AM.          Other   GAD (generalized anxiety disorder)   Recently started the 7.5 mg on the buspirone  3 times a day.  The last couple days he has been tolerating it well.  He is having some rebound sleep issues with the clonazepam  waking up at 1 AM then 3  AM and then 5 AM though it does help him fall asleep.  Did discuss tapering down by maybe alternating between a whole tab and a half a tab for about 3 weeks and then doing 2 nights at a half tab and then the third night a whole tab and then going down to half a tab nightly.  And then we can taper from there.      Other Visit Diagnoses       Upper back pain on left side       Relevant Orders   DG Thoracic Spine W/Swimmers      Upper back pain since he is tender directly over the spine I am concerned for degenerative disc and possibly a pinched nerve.  Will start with a plain film x-ray since this has been going on for a while.  No follow-ups on file.    Dorothyann Byars, MD

## 2023-12-15 NOTE — Assessment & Plan Note (Addendum)
 Pressure little borderline elevated today.  Didn't take his amlodipine  this AM.

## 2023-12-20 ENCOUNTER — Encounter: Payer: Self-pay | Admitting: Family Medicine

## 2023-12-22 ENCOUNTER — Ambulatory Visit: Payer: Self-pay | Admitting: Family Medicine

## 2023-12-22 NOTE — Progress Notes (Signed)
 Hi Clinton Cox, initial x-ray actually looks pretty good.  Alignment is good.  No significant arthritis which is great.  So I do think this is more muscular.  I cannot remember if we give you any exercises that day but I am happy to mail you a copy and/or do formal physical therapy if you would prefer that.

## 2023-12-24 ENCOUNTER — Other Ambulatory Visit: Payer: Self-pay | Admitting: Family Medicine

## 2023-12-24 DIAGNOSIS — I10 Essential (primary) hypertension: Secondary | ICD-10-CM

## 2023-12-28 ENCOUNTER — Other Ambulatory Visit: Payer: Self-pay | Admitting: Family Medicine

## 2023-12-28 DIAGNOSIS — F411 Generalized anxiety disorder: Secondary | ICD-10-CM

## 2023-12-28 DIAGNOSIS — F331 Major depressive disorder, recurrent, moderate: Secondary | ICD-10-CM

## 2023-12-28 DIAGNOSIS — F410 Panic disorder [episodic paroxysmal anxiety] without agoraphobia: Secondary | ICD-10-CM

## 2024-01-03 ENCOUNTER — Encounter: Payer: Self-pay | Admitting: Family Medicine

## 2024-01-06 ENCOUNTER — Other Ambulatory Visit: Payer: Self-pay | Admitting: Vascular Surgery

## 2024-01-06 DIAGNOSIS — R209 Unspecified disturbances of skin sensation: Secondary | ICD-10-CM

## 2024-01-12 ENCOUNTER — Encounter: Payer: Self-pay | Admitting: Family Medicine

## 2024-01-18 ENCOUNTER — Encounter: Payer: Self-pay | Admitting: Sports Medicine

## 2024-02-09 ENCOUNTER — Encounter: Payer: Self-pay | Admitting: Vascular Surgery

## 2024-02-09 ENCOUNTER — Ambulatory Visit (HOSPITAL_COMMUNITY)
Admission: RE | Admit: 2024-02-09 | Discharge: 2024-02-09 | Disposition: A | Discharge status: Home / Self Care | Source: Ambulatory Visit | Attending: Vascular Surgery | Admitting: Vascular Surgery

## 2024-02-09 ENCOUNTER — Ambulatory Visit (INDEPENDENT_AMBULATORY_CARE_PROVIDER_SITE_OTHER): Admitting: Vascular Surgery

## 2024-02-09 VITALS — BP 144/92 | HR 77 | Temp 98.0°F | Resp 96 | Ht 72.0 in | Wt 188.0 lb

## 2024-02-09 DIAGNOSIS — R209 Unspecified disturbances of skin sensation: Secondary | ICD-10-CM | POA: Diagnosis not present

## 2024-02-09 DIAGNOSIS — I7300 Raynaud's syndrome without gangrene: Secondary | ICD-10-CM

## 2024-02-09 LAB — VAS US ABI WITH/WO TBI
Left ABI: 1.15
Right ABI: 1.16
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
## 2024-02-15 ENCOUNTER — Ambulatory Visit (INDEPENDENT_AMBULATORY_CARE_PROVIDER_SITE_OTHER)

## 2024-02-15 ENCOUNTER — Encounter: Payer: Self-pay | Admitting: Family Medicine

## 2024-02-15 ENCOUNTER — Ambulatory Visit (INDEPENDENT_AMBULATORY_CARE_PROVIDER_SITE_OTHER): Admitting: Family Medicine

## 2024-02-15 VITALS — BP 145/75 | HR 80 | Ht 72.0 in | Wt 186.1 lb

## 2024-02-15 DIAGNOSIS — N5082 Scrotal pain: Secondary | ICD-10-CM

## 2024-02-15 DIAGNOSIS — I7300 Raynaud's syndrome without gangrene: Secondary | ICD-10-CM

## 2024-02-15 DIAGNOSIS — Z0000 Encounter for general adult medical examination without abnormal findings: Secondary | ICD-10-CM

## 2024-02-15 DIAGNOSIS — G6182 Multifocal motor neuropathy: Secondary | ICD-10-CM | POA: Insufficient documentation

## 2024-02-15 NOTE — Progress Notes (Signed)
 Complete physical exam  Patient: Clinton Cox   DOB: November 14, 1965   57 y.o. Male  MRN: [id number]  Subjective:    Chief Complaint  Patient presents with   Annual Exam    Pt reports that he discovered a pea sized lump on his R testicle Saturday that has been uncomfortable    Clinton Cox is a 58 y.o. male who presents today for a complete physical exam. He reports consuming a general diet. Doing cardio and strength training daily.  He generally feels well. He reports sleeping still waking up at 3 AM but able to fall back asleep. He does have additional problems to discuss today.    Discussed the use of AI scribe software for clinical note transcription with the patient, who gave verbal consent to proceed.  History of Present Illness Clinton Cox is a 58 year old male who presents with a testicular lump and discomfort.  Testicular mass and discomfort - Onset of testicular discomfort Saturday night, described as 'uncomfortable' rather than painful - Discomfort radiates into the stomach and back - Pea-sized knot palpated on the bottom side of the testicle the following morning, confirmed by his wife - Similar episodes in 2009 and 2017, with prior ultrasounds revealing a prominent blood vessel, possibly a varicocele - History of vasectomy - No current severe pain  Physical activity and symptom correlation - Engages in cross-training exercises including running and push-ups - Recently increased exercise intensity by adding 25-pound dumbbell squats - Questions whether increased physical activity may be related to current symptoms  Sleep disturbance and anxiolytic use - Currently takes diazepam  0.5 mg at night for sleep, transitioned from Klonopin  - Wakes up 3-4 hours into sleep but returns to sleep within 10-15 minutes - Takes 5 mg diazepam  during the day, split into two doses, for vestibular disorder and anxiety - Reports significant improvement in anxiety and vestibular  symptoms with current regimen  Cold extremities and suspected raynaud's phenomenon - Experiences cold hands - Previously evaluated by a circulation specialist who suggested Raynaud's phenomenon  Umbilical hernia history - History of hernia at the belly button - Questions possible relation between hernia and current testicular symptoms    Most recent fall risk assessment:    02/15/2024   12:14 PM  Fall Risk   Number falls in past yr: 0  Injury with Fall? 0  Risk for fall due to : No Fall Risks  Follow up Falls evaluation completed     Most recent depression screenings:    02/15/2024   12:14 PM 11/03/2023   10:58 AM  PHQ 2/9 Scores  PHQ - 2 Score 2   PHQ- 9 Score 7      Information is confidential and restricted. Go to Review Flowsheets to unlock data.        Patient Care Team: Alvan Dorothyann BIRCH, MD as PCP - General (Family Medicine)   Outpatient Medications Prior to Visit  Medication Sig   amLODipine  (NORVASC ) 5 MG tablet TAKE 1 TABLET (5 MG TOTAL) BY MOUTH DAILY. TAKE 1 TABLET BY MOUTH DAILY.   busPIRone  (BUSPAR ) 7.5 MG tablet TAKE 1 TABLET BY MOUTH 3 TIMES DAILY.   diazepam  (VALIUM ) 5 MG tablet Take 5 mg by mouth at bedtime as needed.   valACYclovir  (VALTREX ) 1000 MG tablet Take 1,000 mg by mouth 2 (two) times daily.   [DISCONTINUED] clonazePAM  (KLONOPIN ) 0.5 MG tablet Take 0.5 tablets (0.25 mg total) by mouth daily as needed for anxiety.   [  DISCONTINUED] meloxicam  (MOBIC ) 15 MG tablet TAKE ONE TAB EVERY 24 HOURS WITH A MEAL FOR 2 WEEKS, THEN ONCE EVERY 24 HOURS AS NEEDED FOR PAIN   No facility-administered medications prior to visit.    ROS        Objective:     BP (!) 145/75   Pulse 80   Ht 6' (1.829 m)   Wt 186 lb 1.3 oz (84.4 kg)   SpO2 100%   BMI 25.24 kg/m    Physical Exam Constitutional:      Appearance: Normal appearance.  HENT:     Head: Normocephalic and atraumatic.     Right Ear: Tympanic membrane, ear canal and external ear  normal.     Left Ear: Tympanic membrane, ear canal and external ear normal.     Nose: Nose normal.     Mouth/Throat:     Pharynx: Oropharynx is clear.  Eyes:     Extraocular Movements: Extraocular movements intact.     Conjunctiva/sclera: Conjunctivae normal.     Pupils: Pupils are equal, round, and reactive to light.  Neck:     Thyroid : No thyromegaly.  Cardiovascular:     Rate and Rhythm: Normal rate and regular rhythm.  Pulmonary:     Effort: Pulmonary effort is normal.     Breath sounds: Normal breath sounds.  Abdominal:     General: Bowel sounds are normal.     Palpations: Abdomen is soft.     Tenderness: There is no abdominal tenderness.     Comments: Umbilical hernia present  Musculoskeletal:        General: No swelling.     Cervical back: Neck supple.  Skin:    General: Skin is warm and dry.  Neurological:     Mental Status: He is oriented to person, place, and time.  Psychiatric:        Mood and Affect: Mood normal.        Behavior: Behavior normal.      No results found for any visits on 02/15/24.     Assessment & Plan:    Routine Health Maintenance and Physical Exam  Immunization History  Administered Date(s) Administered   Influenza Split 02/11/2011   Influenza Whole 02/19/2010   Influenza,inj,Quad PF,6+ Mos 03/09/2013, 01/03/2014, 03/22/2015, 01/08/2016   Janssen (J&J) SARS-COV-2 Vaccination 08/17/2019   Td 10/03/2008    Health Maintenance  Topic Date Due   Pneumococcal Vaccine: 50+ Years (1 of 1 - PCV) Never done   DTaP/Tdap/Td (2 - Tdap) 10/04/2018   COVID-19 Vaccine (2 - Janssen risk series) 09/14/2019   Zoster Vaccines- Shingrix (1 of 2) 03/16/2024 (Originally 09/29/1984)   Influenza Vaccine  08/15/2024 (Originally 12/17/2023)   Hepatitis B Vaccines 19-59 Average Risk (1 of 3 - 19+ 3-dose series) 12/14/2024 (Originally 09/29/1984)   Hepatitis C Screening  12/14/2024 (Originally 09/30/1983)   Colonoscopy  04/23/2026   HIV Screening  Completed    HPV VACCINES  Aged Out   Meningococcal B Vaccine  Aged Out    Discussed health benefits of physical activity, and encouraged him to engage in regular exercise appropriate for his age and condition.  Problem List Items Addressed This Visit       Other   Raynaud phenomenon   Work on keeping hands warm.  Already on amlodipine .        Other Visit Diagnoses       Wellness examination    -  Primary   Relevant Orders   CMP14+EGFR  Lipid panel   CBC   TSH   Fe+TIBC+Fer   Vitamin B1   B12 and Folate Panel   Vitamin B6   Magnesium    VITAMIN D  25 Hydroxy (Vit-D Deficiency, Fractures)     Scrotal pain       Relevant Orders   US  SCROTUM W/DOPPLER      Return in about 2 weeks (around 02/29/2024) for nurse vist for bp check.   Keep up a regular exercise program and make sure you are eating a healthy diet Try to eat 4 servings of dairy a day, or if you are lactose intolerant take a calcium with vitamin D  daily.    Dorothyann Byars, MD

## 2024-02-15 NOTE — Assessment & Plan Note (Signed)
 Work on Personnel officer warm.  Already on amlodipine .

## 2024-02-18 ENCOUNTER — Ambulatory Visit: Payer: Self-pay | Admitting: Family Medicine

## 2024-02-18 DIAGNOSIS — R5383 Other fatigue: Secondary | ICD-10-CM

## 2024-02-18 DIAGNOSIS — E782 Mixed hyperlipidemia: Secondary | ICD-10-CM

## 2024-02-18 DIAGNOSIS — I10 Essential (primary) hypertension: Secondary | ICD-10-CM

## 2024-02-18 DIAGNOSIS — N5082 Scrotal pain: Secondary | ICD-10-CM

## 2024-02-18 LAB — CMP14+EGFR
ALT: 16 IU/L (ref 0–44)
AST: 24 IU/L (ref 0–40)
Albumin: 4.7 g/dL (ref 3.8–4.9)
Alkaline Phosphatase: 63 IU/L (ref 47–123)
BUN/Creatinine Ratio: 10 (ref 9–20)
BUN: 11 mg/dL (ref 6–24)
Bilirubin Total: 0.6 mg/dL (ref 0.0–1.2)
CO2: 23 mmol/L (ref 20–29)
Calcium: 9.5 mg/dL (ref 8.7–10.2)
Chloride: 99 mmol/L (ref 96–106)
Creatinine, Ser: 1.08 mg/dL (ref 0.76–1.27)
Globulin, Total: 2.2 g/dL (ref 1.5–4.5)
Glucose: 108 mg/dL — ABNORMAL HIGH (ref 70–99)
Potassium: 4.8 mmol/L (ref 3.5–5.2)
Sodium: 138 mmol/L (ref 134–144)
Total Protein: 6.9 g/dL (ref 6.0–8.5)
eGFR: 80 mL/min/1.73

## 2024-02-18 LAB — IRON,TIBC AND FERRITIN PANEL
Ferritin: 122 ng/mL (ref 30–400)
Iron Saturation: 35 % (ref 15–55)
Iron: 117 ug/dL (ref 38–169)
Total Iron Binding Capacity: 338 ug/dL (ref 250–450)
UIBC: 221 ug/dL (ref 111–343)

## 2024-02-18 LAB — MAGNESIUM: Magnesium: 2.1 mg/dL (ref 1.6–2.3)

## 2024-02-18 LAB — CBC
Hematocrit: 47 % (ref 37.5–51.0)
Hemoglobin: 15.6 g/dL (ref 13.0–17.7)
MCH: 32.2 pg (ref 26.6–33.0)
MCHC: 33.2 g/dL (ref 31.5–35.7)
MCV: 97 fL (ref 79–97)
Platelets: 309 x10E3/uL (ref 150–450)
RBC: 4.85 x10E6/uL (ref 4.14–5.80)
RDW: 12 % (ref 11.6–15.4)
WBC: 5.2 x10E3/uL (ref 3.4–10.8)

## 2024-02-18 LAB — B12 AND FOLATE PANEL
Folate: >20 ng/mL (ref >3.0)
Vitamin B-12: 1445 pg/mL — ABNORMAL HIGH (ref 232–1245)

## 2024-02-18 LAB — VITAMIN B6: Vitamin B6: 9.3 ug/L (ref 3.4–65.2)

## 2024-02-18 LAB — LIPID PANEL
Chol/HDL Ratio: 2.9 ratio (ref 0.0–5.0)
Cholesterol, Total: 220 mg/dL — ABNORMAL HIGH (ref 100–199)
HDL: 76 mg/dL (ref >39)
LDL Chol Calc (NIH): 128 mg/dL — ABNORMAL HIGH (ref 0–99)
Triglycerides: 94 mg/dL (ref 0–149)
VLDL Cholesterol Cal: 16 mg/dL (ref 5–40)

## 2024-02-18 LAB — VITAMIN D 25 HYDROXY (VIT D DEFICIENCY, FRACTURES): Vit D, 25-Hydroxy: 55 ng/mL (ref 30.0–100.0)

## 2024-02-18 LAB — VITAMIN B1: Thiamine: 96.9 nmol/L (ref 66.5–200.0)

## 2024-02-18 LAB — TSH: TSH: 2.86 u[IU]/mL (ref 0.450–4.500)

## 2024-02-18 NOTE — Progress Notes (Signed)
 I agree, hold it for a month and we can recheck or if you want to try taking it 2 days a week if you really like it and we can recheck.

## 2024-02-18 NOTE — Progress Notes (Signed)
 Hi Zakry, total cholesterol and LDL are mildly elevated.  Vitamin B12 is actually high so okay to decrease how often you are taking B12 high levels can cause neuropathy just like low levels can.  Your folate levels are also very high so you can decrease how often you are taking that as well.  Metabolic panel overall looks good.  Blood count looks good no sign of anemia.  Thyroid  looks great.  Iron levels look great.  B6 looks awesome.  Magnesium  looks great.  Vitamin D  looks great.   The only thing still pending is the vitamin B1.

## 2024-02-21 ENCOUNTER — Encounter: Payer: Self-pay | Admitting: Family Medicine

## 2024-02-21 NOTE — Telephone Encounter (Signed)
 Orders Placed This Encounter  Procedures  . Ambulatory referral to Urology    Referral Priority:   Routine    Referral Type:   Consultation    Referral Reason:   Specialty Services Required    Requested Specialty:   Urology    Number of Visits Requested:   1

## 2024-02-21 NOTE — Progress Notes (Signed)
 Hi Clinton Cox, the ultrasound did show a nodule measuring approximately 1 cm at the right epididymal tail.  They did not see any extra fluid or varicosities which is good.  But because the nodule is measuring a centimeter I would like to refer you to a urologist for further evaluation.  If you are okay with that please let us  know and if you have a provider preference or location preference please let us  know.

## 2024-02-29 ENCOUNTER — Ambulatory Visit

## 2024-03-01 NOTE — Telephone Encounter (Signed)
 We can plan to do the labs in about 6 weeks.

## 2024-03-01 NOTE — Telephone Encounter (Signed)
 Labs ordered.

## 2024-03-01 NOTE — Addendum Note (Signed)
 Addended by: FREYA BASCOM CROME on: 03/01/2024 03:37 PM   Modules accepted: Orders

## 2024-03-15 ENCOUNTER — Encounter: Payer: Self-pay | Admitting: Family Medicine

## 2024-03-15 DIAGNOSIS — R5383 Other fatigue: Secondary | ICD-10-CM

## 2024-03-15 DIAGNOSIS — N5082 Scrotal pain: Secondary | ICD-10-CM

## 2024-03-15 DIAGNOSIS — E782 Mixed hyperlipidemia: Secondary | ICD-10-CM

## 2024-03-15 DIAGNOSIS — I10 Essential (primary) hypertension: Secondary | ICD-10-CM

## 2024-03-15 DIAGNOSIS — Z124 Encounter for screening for malignant neoplasm of cervix: Secondary | ICD-10-CM

## 2024-04-17 ENCOUNTER — Encounter: Payer: Self-pay | Admitting: Family Medicine

## 2024-04-17 DIAGNOSIS — R5383 Other fatigue: Secondary | ICD-10-CM

## 2024-04-17 DIAGNOSIS — R531 Weakness: Secondary | ICD-10-CM

## 2024-04-17 DIAGNOSIS — Z125 Encounter for screening for malignant neoplasm of prostate: Secondary | ICD-10-CM

## 2024-04-18 ENCOUNTER — Ambulatory Visit

## 2024-04-18 DIAGNOSIS — E782 Mixed hyperlipidemia: Secondary | ICD-10-CM

## 2024-04-18 DIAGNOSIS — R5383 Other fatigue: Secondary | ICD-10-CM

## 2024-04-18 DIAGNOSIS — N5082 Scrotal pain: Secondary | ICD-10-CM

## 2024-04-18 DIAGNOSIS — I10 Essential (primary) hypertension: Secondary | ICD-10-CM

## 2024-04-18 NOTE — Telephone Encounter (Signed)
 Labs ordered.

## 2024-04-20 ENCOUNTER — Ambulatory Visit: Payer: Self-pay | Admitting: Family Medicine

## 2024-04-20 NOTE — Progress Notes (Signed)
 Hi Clinton Cox, LDL does look a little bit better but still mildly elevated continue to work on healthy Mediterranean diet and regular exercise.  Total testosterone  looks good.  B12 looks good folate looks great.  Thyroid  looks good.  Prostate test is normal.  Metabolic panel is normal.  A1c looks great at 5.3.

## 2024-04-21 LAB — LIPID PANEL WITH LDL/HDL RATIO
Cholesterol, Total: 193 mg/dL (ref 100–199)
HDL: 54 mg/dL (ref >39)
LDL Chol Calc (NIH): 119 mg/dL — ABNORMAL HIGH (ref 0–99)
LDL/HDL Ratio: 2.2 ratio (ref 0.0–3.6)
Triglycerides: 113 mg/dL (ref 0–149)
VLDL Cholesterol Cal: 20 mg/dL (ref 5–40)

## 2024-04-21 LAB — HEMOGLOBIN A1C
Est. average glucose Bld gHb Est-mCnc: 105 mg/dL
Hgb A1c MFr Bld: 5.3 % (ref 4.8–5.6)

## 2024-04-21 LAB — B12 AND FOLATE PANEL
Folate: >20 ng/mL (ref >3.0)
Vitamin B-12: 455 pg/mL (ref 232–1245)

## 2024-04-21 LAB — CMP14+EGFR
ALT: 14 IU/L (ref 0–44)
AST: 17 IU/L (ref 0–40)
Albumin: 4.4 g/dL (ref 3.8–4.9)
Alkaline Phosphatase: 55 IU/L (ref 47–123)
BUN/Creatinine Ratio: 14 (ref 9–20)
BUN: 14 mg/dL (ref 6–24)
Bilirubin Total: 0.5 mg/dL (ref 0.0–1.2)
CO2: 23 mmol/L (ref 20–29)
Calcium: 9.2 mg/dL (ref 8.7–10.2)
Chloride: 101 mmol/L (ref 96–106)
Creatinine, Ser: 0.98 mg/dL (ref 0.76–1.27)
Globulin, Total: 2.1 g/dL (ref 1.5–4.5)
Glucose: 97 mg/dL (ref 70–99)
Potassium: 4.6 mmol/L (ref 3.5–5.2)
Sodium: 138 mmol/L (ref 134–144)
Total Protein: 6.5 g/dL (ref 6.0–8.5)
eGFR: 89 mL/min/1.73 (ref >59)

## 2024-04-21 LAB — VITAMIN B6: Vitamin B6: 10.3 ug/L (ref 3.4–65.2)

## 2024-04-21 LAB — TSH: TSH: 3.16 u[IU]/mL (ref 0.450–4.500)

## 2024-04-21 LAB — TESTOSTERONE,FREE AND TOTAL
Testosterone, Free: 6.7 pg/mL — ABNORMAL LOW (ref 7.2–24.0)
Testosterone: 514 ng/dL (ref 264–916)

## 2024-04-21 LAB — PSA: Prostate Specific Ag, Serum: 1 ng/mL (ref 0.0–4.0)

## 2024-04-24 NOTE — Progress Notes (Signed)
 B6 level looks good.  We can always recheck the testosterone  again in about 3 months.

## 2024-04-25 ENCOUNTER — Ambulatory Visit: Payer: Self-pay | Admitting: Family Medicine

## 2024-04-25 NOTE — Progress Notes (Signed)
 Hi Detavious, the epididymis on the right side is mildly enlarged but no worrisome findings.  The 1 cm opacity seen previously on the epididymis is no longer there which is great.  Normal blood flow which is great.

## 2024-04-26 NOTE — Addendum Note (Signed)
 Addended by: Redina Zeller P on: 04/26/2024 02:00 PM   Modules accepted: Orders
